# Patient Record
Sex: Female | Born: 1965 | Race: Black or African American | Hispanic: No | Marital: Married | Smoking: Current every day smoker
Health system: Southern US, Community
[De-identification: ages and names within clinical notes are randomized; demographics above are authoritative.]

## PROBLEM LIST (undated history)

## (undated) DIAGNOSIS — I10 Essential (primary) hypertension: Secondary | ICD-10-CM

## (undated) HISTORY — PX: COLPOSCOPY: SHX161

---

## 2004-06-17 ENCOUNTER — Emergency Department: Payer: Self-pay | Admitting: Emergency Medicine

## 2004-06-24 ENCOUNTER — Emergency Department: Payer: Self-pay | Admitting: Unknown Physician Specialty

## 2004-06-25 ENCOUNTER — Emergency Department: Payer: Self-pay | Admitting: Emergency Medicine

## 2004-06-27 ENCOUNTER — Emergency Department: Payer: Self-pay | Admitting: Emergency Medicine

## 2004-09-20 ENCOUNTER — Emergency Department: Payer: Self-pay | Admitting: Emergency Medicine

## 2004-09-21 ENCOUNTER — Emergency Department: Payer: Self-pay | Admitting: Unknown Physician Specialty

## 2004-10-11 ENCOUNTER — Emergency Department: Payer: Self-pay | Admitting: Emergency Medicine

## 2005-01-20 ENCOUNTER — Emergency Department: Payer: Self-pay | Admitting: Emergency Medicine

## 2005-04-27 ENCOUNTER — Emergency Department: Payer: Self-pay | Admitting: Unknown Physician Specialty

## 2005-04-27 ENCOUNTER — Other Ambulatory Visit: Payer: Self-pay

## 2006-01-12 ENCOUNTER — Emergency Department: Payer: Self-pay | Admitting: Unknown Physician Specialty

## 2006-02-12 ENCOUNTER — Emergency Department: Payer: Self-pay | Admitting: Emergency Medicine

## 2006-06-11 ENCOUNTER — Ambulatory Visit: Payer: Self-pay | Admitting: Podiatry

## 2006-09-02 ENCOUNTER — Emergency Department: Payer: Self-pay | Admitting: Emergency Medicine

## 2006-09-04 ENCOUNTER — Emergency Department: Payer: Self-pay | Admitting: Emergency Medicine

## 2006-10-04 ENCOUNTER — Ambulatory Visit: Payer: Self-pay | Admitting: Unknown Physician Specialty

## 2006-10-07 ENCOUNTER — Ambulatory Visit: Payer: Self-pay | Admitting: Unknown Physician Specialty

## 2008-03-21 ENCOUNTER — Emergency Department: Payer: Self-pay | Admitting: Emergency Medicine

## 2009-01-15 ENCOUNTER — Emergency Department: Payer: Self-pay | Admitting: Internal Medicine

## 2009-02-07 ENCOUNTER — Emergency Department: Payer: Self-pay | Admitting: Emergency Medicine

## 2009-04-05 ENCOUNTER — Emergency Department: Payer: Self-pay | Admitting: Emergency Medicine

## 2011-01-13 ENCOUNTER — Emergency Department: Payer: Self-pay | Admitting: Emergency Medicine

## 2011-01-28 ENCOUNTER — Emergency Department: Payer: Self-pay | Admitting: Unknown Physician Specialty

## 2011-03-01 ENCOUNTER — Emergency Department: Payer: Self-pay | Admitting: Emergency Medicine

## 2013-08-24 ENCOUNTER — Emergency Department: Payer: Self-pay

## 2013-08-24 LAB — URINALYSIS, COMPLETE
Bilirubin,UR: NEGATIVE
Blood: NEGATIVE
Glucose,UR: NEGATIVE mg/dL (ref 0–75)
NITRITE: POSITIVE
Ph: 6 (ref 4.5–8.0)
RBC,UR: 3 /HPF (ref 0–5)
SPECIFIC GRAVITY: 1.026 (ref 1.003–1.030)
Squamous Epithelial: 3
WBC UR: 5 /HPF (ref 0–5)

## 2013-08-24 LAB — DRUG SCREEN, URINE

## 2015-10-01 ENCOUNTER — Emergency Department
Admission: EM | Admit: 2015-10-01 | Discharge: 2015-10-01 | Disposition: A | Discharge status: Home / Self Care | Payer: Self-pay | Attending: Emergency Medicine | Admitting: Emergency Medicine

## 2015-10-01 ENCOUNTER — Emergency Department: Payer: Self-pay

## 2015-10-01 DIAGNOSIS — M25461 Effusion, right knee: Secondary | ICD-10-CM | POA: Insufficient documentation

## 2015-10-01 MED ORDER — OXYCODONE-ACETAMINOPHEN 5-325 MG PO TABS
2.0000 | ORAL_TABLET | Freq: Once | ORAL | Status: AC
  Administered 2015-10-01: 2 via ORAL
  Filled 2015-10-01: qty 2

## 2015-10-01 MED ORDER — OXYCODONE-ACETAMINOPHEN 5-325 MG PO TABS: 1.0000 | ORAL_TABLET | ORAL | Status: DC | PRN

## 2015-10-01 MED ORDER — NAPROXEN 500 MG PO TABS: 500.0000 mg | ORAL_TABLET | Freq: Two times a day (BID) | ORAL | Status: DC

## 2015-10-01 NOTE — Discharge Instructions (Signed)
Knee Effusion Knee effusion means that you have excess fluid in your knee joint. This can cause pain and swelling in your knee. This may make your knee more difficult to bend and move. That is because there is increased pain and pressure in the joint. If there is fluid in your knee, it often means that something is wrong inside your knee, such as severe arthritis, abnormal inflammation, or an infection. Another common cause of knee effusion is an injury to the knee muscles, ligaments, or cartilage. HOME CARE INSTRUCTIONS  Use crutches as directed by your health care provider.  Wear a knee brace as directed by your health care provider.  Apply ice to the swollen area:  Put ice in a plastic bag.  Place a towel between your skin and the bag.  Leave the ice on for 20 minutes, 2-3 times per day.  Keep your knee raised (elevated) when you are sitting or lying down.  Take medicines only as directed by your health care provider.  Do any rehabilitation or strengthening exercises as directed by your health care provider.  Rest your knee as directed by your health care provider. You may start doing your normal activities again when your health care provider approves.   Keep all follow-up visits as directed by your health care provider. This is important. SEEK MEDICAL CARE IF:  You have ongoing (persistent) pain in your knee. SEEK IMMEDIATE MEDICAL CARE IF:  You have increased swelling or redness of your knee.  You have severe pain in your knee.  You have a fever.   This information is not intended to replace advice given to you by your health care provider. Make sure you discuss any questions you have with your health care provider.   Document Released: 05/23/2003 Document Revised: 03/23/2014 Document Reviewed: 10/16/2013 Elsevier Interactive Patient Education 2016 [place] therapy can help ease sore, stiff, injured, and tight muscles and joints. Heat relaxes  your muscles, which may help ease your pain. Heat therapy should only be used on old, pre-existing, or long-lasting (chronic) injuries. Do not use heat therapy unless told by your doctor. HOW TO USE HEAT THERAPY There are several different kinds of heat therapy, including:  Moist heat pack.  Warm water bath.  Hot water bottle.  Electric heating pad.  Heated gel pack.  Heated wrap.  Electric heating pad. GENERAL HEAT THERAPY RECOMMENDATIONS   Do not sleep while using heat therapy. Only use heat therapy while you are awake.  Your skin may turn pink while using heat therapy. Do not use heat therapy if your skin turns red.  Do not use heat therapy if you have new pain.  High heat or long exposure to heat can cause burns. Be careful when using heat therapy to avoid burning your skin.  Do not use heat therapy on areas of your skin that are already irritated, such as with a rash or sunburn. GET HELP IF:   You have blisters, redness, swelling (puffiness), or numbness.  You have new pain.  Your pain is worse. MAKE SURE YOU:  Understand these instructions.  Will watch your condition.  Will get help right away if you are not doing well or get worse.   This information is not intended to replace advice given to you by your health care provider. Make sure you discuss any questions you have with your health care provider.   Document Released: 05/25/2011 Document Revised: 03/23/2014 Document Reviewed: 04/25/2013 Elsevier Interactive Patient  Education ©2016 Elsevier Inc. ° °

## 2015-10-01 NOTE — ED Notes (Signed)
Pt reports right knee pain and swelling x1 month; denies injury. Pt ambulatory to triage.

## 2015-10-01 NOTE — ED Provider Notes (Signed)
[facility] Emergency Department Provider Note  ____________________________________________  Time seen: Approximately 8:08 AM  I have reviewed the triage vital signs and the nursing notes.   HISTORY  Chief Complaint Knee Pain    HPI Teresa Manning is a 50 y.o. female who presents for evaluation of progressive right knee pain and swelling 1 month. Denies any known injury. States walking has become increasingly difficult. Denies any numbness or tingling or radiation of pain inside the knee area. Scribe's her pain as 6/10 with no relief with over-the-counter medications   No past medical history on file.  There are no active problems to display for this patient.   No past surgical history on file.  Current Outpatient Rx  Name  Route  Sig  Dispense  Refill  . naproxen (NAPROSYN) 500 MG tablet   Oral   Take 1 tablet (500 mg total) by mouth 2 (two) times daily with a meal.   60 tablet   0   . oxyCODONE-acetaminophen (ROXICET) 5-325 MG tablet   Oral   Take 1-2 tablets by mouth every 4 (four) hours as needed for severe pain.   15 tablet   0     Allergies Review of patient's allergies indicates no known allergies.  No family history on file.  Social History Social History  Substance Use Topics  . Smoking status: Not on file  . Smokeless tobacco: Not on file  . Alcohol Use: Not on file    Review of Systems Constitutional: No fever/chills Cardiovascular: Denies chest pain. Respiratory: Denies shortness of breath. Musculoskeletal: Positive for right knee pain and swelling Skin: Negative for rash. Neurological: Negative for headaches, focal weakness or numbness.  10-point ROS otherwise negative.  ____________________________________________   PHYSICAL EXAM:  VITAL SIGNS: ED Triage Vitals  Enc Vitals Group     BP 10/01/15 0749 136/59 mmHg     Pulse Rate 10/01/15 0749 66     Resp 10/01/15 0749 16     Temp 10/01/15 0749 98.4 F  (36.9 C)     Temp Source 10/01/15 0749 Oral     SpO2 10/01/15 0749 95 %     Weight 10/01/15 0744 235 lb (106.595 kg)     Height 10/01/15 0744 5\' 9"  (1.753 m)     Head Cir --      Peak Flow --      Pain Score 10/01/15 0744 6     Pain Loc --      Pain Edu? --      Excl. in [place]? --     Constitutional: Alert and oriented. Well appearing and in no acute distress.  Cardiovascular: Normal rate, regular rhythm. Grossly normal heart sounds.  Good peripheral circulation. Respiratory: Normal respiratory effort.  No retractions. Lungs CTAB. Musculoskeletal: Right knee with limited range of motion and increased pain with flexion. Distally neurovascularly intact. Knee is obviously boggy and tender fluid noted to the inferior lateral aspect of the patella. No ecchymosis or bruising noted. Neurologic:  Normal speech and language. No gross focal neurologic deficits are appreciated. No gait instability. Skin:  Skin is warm, dry and intact. No rash noted. Psychiatric: Mood and affect are normal. Speech and behavior are normal.  ____________________________________________   LABS (all labs ordered are listed, but only abnormal results are displayed)  Labs Reviewed - No data to display ____________________________________________  EKG   ____________________________________________  RADIOLOGY  IMPRESSION: No fracture or dislocation.  Mild patella [place].  Mild patellofemoral joint degenerative changes.  Minimal medial tibiofemoral joint space narrowing.  Small suprapatellar joint effusion. ____________________________________________   PROCEDURES  Procedure(s) performed: None  Critical Care performed: No  ____________________________________________   INITIAL IMPRESSION / ASSESSMENT AND PLAN / ED COURSE  Pertinent labs & imaging results that were available during my care of the patient were reviewed by me and considered in my medical decision making (see chart for  details).  Right knee joint effusion. Patient has a brace at home that she will continue meters Rx given for Naprosyn 500 mg twice a day continue heat and rest and elevation as much as possible. She refused a work note and to follow-up with orthopedics as needed. ____________________________________________   FINAL CLINICAL IMPRESSION(S) / ED DIAGNOSES  Final diagnoses:  Knee effusion, right     This chart was dictated using voice recognition software/Dragon. Despite best efforts to proofread, errors can occur which can change the meaning. Any change was purely unintentional.   Arlyss Repress, PA-C 10/01/15 0902  Drenda Freeze, MD 10/01/15 1110

## 2016-04-12 ENCOUNTER — Emergency Department
Admission: EM | Admit: 2016-04-12 | Discharge: 2016-04-12 | Disposition: A | Discharge status: Home / Self Care | Payer: BLUE CROSS/BLUE SHIELD | Attending: Emergency Medicine | Admitting: Emergency Medicine

## 2016-04-12 ENCOUNTER — Encounter: Payer: Self-pay | Admitting: Emergency Medicine

## 2016-04-12 DIAGNOSIS — F17210 Nicotine dependence, cigarettes, uncomplicated: Secondary | ICD-10-CM | POA: Diagnosis not present

## 2016-04-12 DIAGNOSIS — R51 Headache: Secondary | ICD-10-CM | POA: Diagnosis not present

## 2016-04-12 DIAGNOSIS — M791 Myalgia: Secondary | ICD-10-CM | POA: Diagnosis not present

## 2016-04-12 DIAGNOSIS — R5381 Other malaise: Secondary | ICD-10-CM | POA: Insufficient documentation

## 2016-04-12 DIAGNOSIS — R0981 Nasal congestion: Secondary | ICD-10-CM | POA: Insufficient documentation

## 2016-04-12 DIAGNOSIS — R69 Illness, unspecified: Secondary | ICD-10-CM

## 2016-04-12 DIAGNOSIS — J111 Influenza due to unidentified influenza virus with other respiratory manifestations: Secondary | ICD-10-CM

## 2016-04-12 DIAGNOSIS — R05 Cough: Secondary | ICD-10-CM | POA: Diagnosis not present

## 2016-04-12 LAB — URINALYSIS, COMPLETE (UACMP) WITH MICROSCOPIC
Bilirubin Urine: NEGATIVE
Glucose, UA: NEGATIVE mg/dL
Ketones, ur: NEGATIVE mg/dL
Leukocytes, UA: NEGATIVE
Nitrite: NEGATIVE
Protein, ur: 30 mg/dL — AB
Specific Gravity, Urine: 1.028 (ref 1.005–1.030)
pH: 5 (ref 5.0–8.0)

## 2016-04-12 MED ORDER — ACETAMINOPHEN 325 MG PO TABS
650.0000 mg | ORAL_TABLET | Freq: Once | ORAL | Status: AC | PRN
  Administered 2016-04-12: 650 mg via ORAL

## 2016-04-12 MED ORDER — BENZONATATE 100 MG PO CAPS: 100.0000 mg | ORAL_CAPSULE | Freq: Three times a day (TID) | ORAL | 0 refills | Status: AC | PRN

## 2016-04-12 MED ORDER — ACETAMINOPHEN 325 MG PO TABS
ORAL_TABLET | ORAL | Status: AC
  Filled 2016-04-12: qty 2

## 2016-04-12 MED ORDER — OSELTAMIVIR PHOSPHATE 75 MG PO CAPS: 75.0000 mg | ORAL_CAPSULE | Freq: Two times a day (BID) | ORAL | 0 refills | Status: AC

## 2016-04-12 NOTE — ED Triage Notes (Signed)
Pt from home with productive cough and generalized body aches since Thursday. Pt states she is coughing a lot and that causes her bladder to leak, which she states is "strong-smelling."  She also reports less urination than usual. Pt alert & oriented with NAD noted.

## 2016-04-12 NOTE — ED Notes (Signed)
First nurse note   Developed cough  Sore throat and body aches on friday

## 2016-04-12 NOTE — ED Notes (Signed)
AAOx3.  Skin warm and dry.  NAD 

## 2016-04-12 NOTE — ED Provider Notes (Signed)
[facility] Emergency Department Provider Note  ____________________________________________  Time seen: Approximately 10:10 AM  I have reviewed the triage vital signs and the nursing notes.   HISTORY  Chief Complaint Generalized Body Aches and Cough    HPI Teresa Manning is a 51 y.o. female presenting to the emergency department with headache, congestion, malaise, myalgias and fatigue for the past 2 days. Patient has numerous sick contacts in her job at [employer]. Patient has been febrile. Fever has been as high as 101F assessed orally. Patient states that she has been eating and drinking less due to increased sleep. Patient states that she is tolerating fluids by mouth. She denies chest pain, chest tightness, dizziness, shortness of breath, nausea, abdominal pain and diarrhea. Patient has taken Tylenol but has attempted no other alleviating measures.   History reviewed. No pertinent past medical history.  There are no active problems to display for this patient.   History reviewed. No pertinent surgical history.  Prior to Admission medications   Medication Sig Start Date End Date Taking? Authorizing Provider  benzonatate (TESSALON PERLES) 100 MG capsule Take 1 capsule (100 mg total) by mouth 3 (three) times daily as needed for cough. 04/12/16 04/19/16  Lannie Fields, PA-C  naproxen (NAPROSYN) 500 MG tablet Take 1 tablet (500 mg total) by mouth 2 (two) times daily with a meal. 10/01/15   Arlyss Repress, PA-C  oseltamivir (TAMIFLU) 75 MG capsule Take 1 capsule (75 mg total) by mouth 2 (two) times daily. 04/12/16 04/17/16  Lannie Fields, PA-C  oxyCODONE-acetaminophen (ROXICET) 5-325 MG tablet Take 1-2 tablets by mouth every 4 (four) hours as needed for severe pain. 10/01/15   Arlyss Repress, PA-C    Allergies Patient has no known allergies.  History reviewed. No pertinent family history.  Social History Social History  Substance Use Topics  . Smoking status:  Current Every Day Smoker    Packs/day: 1.00    Types: Cigarettes  . Smokeless tobacco: Never Used  . Alcohol use No     Review of Systems  Constitutional: Patient has had fever.  Eyes: No visual changes. No discharge ENT: Patient has had congestion.  Cardiovascular: no chest pain. Respiratory: Patient has had non-productive cough.  No SOB. Gastrointestinal: Patient has had nausea.  Genitourinary: Negative for dysuria. No hematuria Musculoskeletal: Patient has had myalgias. Skin: Negative for rash, abrasions, lacerations, ecchymosis. Neurological: Patient has had headache, no focal weakness or numbness.   ____________________________________________   PHYSICAL EXAM:  VITAL SIGNS: ED Triage Vitals  Enc Vitals Group     BP 04/12/16 0833 135/74     Pulse Rate 04/12/16 0833 (!) 102     Resp 04/12/16 0833 16     Temp 04/12/16 0833 100.3 F (37.9 C)     Temp Source 04/12/16 0833 Oral     SpO2 04/12/16 0833 96 %     Weight 04/12/16 0835 245 lb (111.1 kg)     Height 04/12/16 0835 5\' 9"  (1.753 m)     Head Circumference --      Peak Flow --      Pain Score 04/12/16 0935 6     Pain Loc --      Pain Edu? --      Excl. in [place]? --     Constitutional: Alert and oriented. Patient is lying supine in bed.  Eyes: Conjunctivae are normal. PERRL. EOMI. Head: Atraumatic. ENT:      Ears: Tympanic membranes are injected bilaterally  without evidence of effusion or purulent exudate. Bony landmarks are visualized bilaterally. No pain with palpation at the tragus.      Nose: Nasal turbinates are edematous and erythematous. Copious rhinorrhea visualized.      Mouth/Throat: Mucous membranes are moist. Posterior pharynx is mildly erythematous. No tonsillar hypertrophy or purulent exudate. Uvula is midline. Neck: Full range of motion. No pain is elicited with flexion at the neck. Hematological/Lymphatic/Immunilogical: No cervical lymphadenopathy. Cardiovascular: Normal rate, regular rhythm.  Normal S1 and S2.  Good peripheral circulation. Respiratory: Normal respiratory effort without tachypnea or retractions. Lungs CTAB. Good air entry to the bases with no decreased or absent breath sounds. Gastrointestinal: Bowel sounds 4 quadrants. Soft and nontender to palpation. No guarding or rigidity. No palpable masses. No distention. No CVA tenderness.  Skin:  Skin is warm, dry and intact. No rash noted. Psychiatric: Mood and affect are normal. Speech and behavior are normal. Patient exhibits appropriate insight and judgement. ____________________________________________   LABS (all labs ordered are listed, but only abnormal results are displayed)  Labs Reviewed  URINALYSIS, COMPLETE (UACMP) WITH MICROSCOPIC - Abnormal; Notable for the following:       Result Value   Color, Urine YELLOW (*)    APPearance HAZY (*)    Hgb urine dipstick SMALL (*)    Protein, ur 30 (*)    Bacteria, UA RARE (*)    Squamous Epithelial / LPF 6-30 (*)    All other components within normal limits   ____________________________________________  EKG   ____________________________________________  RADIOLOGY  No results found.  ____________________________________________    PROCEDURES  Procedure(s) performed:    Procedures    Medications  acetaminophen (TYLENOL) tablet 650 mg (650 mg Oral Given 04/12/16 0931)     ____________________________________________   INITIAL IMPRESSION / ASSESSMENT AND PLAN / ED COURSE  Pertinent labs & imaging results that were available during my care of the patient were reviewed by me and considered in my medical decision making (see chart for details).  Review of the [place] CSRS was performed in accordance of the [place] prior to dispensing any controlled drugs.    Assessment and Plan:  Influenza A Patient presents to the emergency department with headache, congestion, malaise, myalgias and fatigue for the past 2 days. Symptoms are consistent with  influenza. Patient was discharged with Tamiflu and Tessalon Perles. Physical exam and vitals are reassuring at this time. Heart rate was reassessed at 98 bpm prior to discharge. Patient was advised to follow-up with her primary care provider in one week. All patient questions were answered.  ____________________________________________  FINAL CLINICAL IMPRESSION(S) / ED DIAGNOSES  Final diagnoses:  Influenza-like illness      NEW MEDICATIONS STARTED DURING THIS VISIT:  Discharge Medication List as of 04/12/2016 10:17 AM    START taking these medications   Details  benzonatate (TESSALON PERLES) 100 MG capsule Take 1 capsule (100 mg total) by mouth 3 (three) times daily as needed for cough., Starting Sun 04/12/2016, Until Sun 04/19/2016, Print    oseltamivir (TAMIFLU) 75 MG capsule Take 1 capsule (75 mg total) by mouth 2 (two) times daily., Starting Sun 04/12/2016, Until Fri 04/17/2016, Print            This chart was dictated using voice recognition software/Dragon. Despite best efforts to proofread, errors can occur which can change the meaning. Any change was purely unintentional.    Lannie Fields, PA-C 04/12/16 1856    Lavonia Drafts, MD 04/13/16 [phone]

## 2016-04-21 ENCOUNTER — Emergency Department: Payer: BLUE CROSS/BLUE SHIELD

## 2016-04-21 ENCOUNTER — Emergency Department
Admission: EM | Admit: 2016-04-21 | Discharge: 2016-04-21 | Disposition: A | Discharge status: Home / Self Care | Payer: BLUE CROSS/BLUE SHIELD | Attending: Emergency Medicine | Admitting: Emergency Medicine

## 2016-04-21 DIAGNOSIS — R112 Nausea with vomiting, unspecified: Secondary | ICD-10-CM

## 2016-04-21 DIAGNOSIS — R197 Diarrhea, unspecified: Secondary | ICD-10-CM | POA: Diagnosis not present

## 2016-04-21 DIAGNOSIS — F17210 Nicotine dependence, cigarettes, uncomplicated: Secondary | ICD-10-CM | POA: Insufficient documentation

## 2016-04-21 DIAGNOSIS — J40 Bronchitis, not specified as acute or chronic: Secondary | ICD-10-CM | POA: Diagnosis not present

## 2016-04-21 LAB — CBC WITH DIFFERENTIAL/PLATELET
BASOS ABS: 0 10*3/uL (ref 0–0.1)
BASOS PCT: 0 %
Eosinophils Absolute: 0.1 10*3/uL (ref 0–0.7)
Eosinophils Relative: 1 %
HEMATOCRIT: 42.4 % (ref 35.0–47.0)
HEMOGLOBIN: 14 g/dL (ref 12.0–16.0)
LYMPHS PCT: 9 %
Lymphs Abs: 0.7 10*3/uL — ABNORMAL LOW (ref 1.0–3.6)
MCH: 29.2 pg (ref 26.0–34.0)
MCHC: 33 g/dL (ref 32.0–36.0)
MCV: 88.7 fL (ref 80.0–100.0)
Monocytes Absolute: 0.3 10*3/uL (ref 0.2–0.9)
Monocytes Relative: 4 %
NEUTROS ABS: 6.5 10*3/uL (ref 1.4–6.5)
NEUTROS PCT: 86 %
Platelets: 267 10*3/uL (ref 150–440)
RBC: 4.78 MIL/uL (ref 3.80–5.20)
RDW: 13.8 % (ref 11.5–14.5)
WBC: 7.6 10*3/uL (ref 3.6–11.0)

## 2016-04-21 LAB — BASIC METABOLIC PANEL
ANION GAP: 6 (ref 5–15)
BUN: 10 mg/dL (ref 6–20)
CALCIUM: 8.3 mg/dL — AB (ref 8.9–10.3)
CO2: 26 mmol/L (ref 22–32)
Chloride: 104 mmol/L (ref 101–111)
Creatinine, Ser: 0.8 mg/dL (ref 0.44–1.00)
Glucose, Bld: 123 mg/dL — ABNORMAL HIGH (ref 65–99)
POTASSIUM: 4 mmol/L (ref 3.5–5.1)
Sodium: 136 mmol/L (ref 135–145)

## 2016-04-21 MED ORDER — ONDANSETRON 4 MG PO TBDP: 4.0000 mg | ORAL_TABLET | Freq: Four times a day (QID) | ORAL | 0 refills | Status: DC | PRN

## 2016-04-21 MED ORDER — DICYCLOMINE HCL 20 MG PO TABS: 20.0000 mg | ORAL_TABLET | Freq: Three times a day (TID) | ORAL | 0 refills | Status: DC | PRN

## 2016-04-21 MED ORDER — ACETAMINOPHEN 500 MG PO TABS
1000.0000 mg | ORAL_TABLET | Freq: Once | ORAL | Status: AC
  Administered 2016-04-21: 1000 mg via ORAL

## 2016-04-21 MED ORDER — ONDANSETRON HCL 4 MG/2ML IJ SOLN
4.0000 mg | Freq: Once | INTRAMUSCULAR | Status: AC
  Administered 2016-04-21: 4 mg via INTRAVENOUS
  Filled 2016-04-21: qty 2

## 2016-04-21 MED ORDER — ACETAMINOPHEN 500 MG PO TABS
ORAL_TABLET | ORAL | Status: AC
  Filled 2016-04-21: qty 2

## 2016-04-21 MED ORDER — PSEUDOEPH-BROMPHEN-DM 30-2-10 MG/5ML PO SYRP: 5.0000 mL | ORAL_SOLUTION | Freq: Four times a day (QID) | ORAL | 0 refills | Status: DC | PRN

## 2016-04-21 MED ORDER — SODIUM CHLORIDE 0.9 % IV BOLUS (SEPSIS)
1000.0000 mL | Freq: Once | INTRAVENOUS | Status: AC
  Administered 2016-04-21: 1000 mL via INTRAVENOUS

## 2016-04-21 MED ORDER — FAMOTIDINE IN NACL 20-0.9 MG/50ML-% IV SOLN
20.0000 mg | Freq: Once | INTRAVENOUS | Status: AC
  Administered 2016-04-21: 20 mg via INTRAVENOUS
  Filled 2016-04-21: qty 50

## 2016-04-21 NOTE — ED Provider Notes (Signed)
[facility] Emergency Department Provider Note ____________________________________________  Time seen: 1930  I have reviewed the triage vital signs and the nursing notes.  HISTORY  Chief Complaint  Vomiting and Diarrhea   HPI Teresa Manning is a 51 y.o. female presents to the ED for evaluation of sudden onset of nausea, vomiting, and diarrhea. The patient describes eating which she assumed to be a three-day tuna sandwhich may by a coworker. She ate the salad at about 11:00 last night. At times she awoke this morning she had the onset of watery diarrhea, abdominal cramping, and malaise. She describes non-bilious nonbloody vomitus that did slow down after a coworker gave her a nausea medicine. She describes most recent episode was just prior to arrival here in the ED. She reports she was seen in the ED about a week and a half ago and treated for influenza. She was given a Tamiflu prescription, but did not fill it as the pharmacy had no Tamiflu supplies. Since that time he's had a lingering cough as well. He ascribes taken a dose of Imodium, Zofran, and some form of upset stomach pill today prior to arrival. She was unaware of her fever upon arrival.  History reviewed. No pertinent past medical history.  There are no active problems to display for this patient.  History reviewed. No pertinent surgical history.  Prior to Admission medications   Medication Sig Start Date End Date Taking? Authorizing Provider  brompheniramine-pseudoephedrine-DM (BROMFED DM) 30-2-10 MG/5ML syrup Take 5 mLs by mouth 4 (four) times daily as needed. 04/21/16   Rhylee Pucillo V Bacon Tejuan Gholson, PA-C  dicyclomine (BENTYL) 20 MG tablet Take 1 tablet (20 mg total) by mouth 3 (three) times daily as needed for spasms. 04/21/16 04/21/17  Dannielle Karvonen Ciria Bernardini, PA-C  naproxen (NAPROSYN) 500 MG tablet Take 1 tablet (500 mg total) by mouth 2 (two) times daily with a meal. 10/01/15   Pierce Crane Beers, PA-C  ondansetron  (ZOFRAN ODT) 4 MG disintegrating tablet Take 1 tablet (4 mg total) by mouth every 6 (six) hours as needed for nausea or vomiting. 04/21/16   Staphanie Harbison V Bacon Rhea Thrun, PA-C  oxyCODONE-acetaminophen (ROXICET) 5-325 MG tablet Take 1-2 tablets by mouth every 4 (four) hours as needed for severe pain. 10/01/15   Arlyss Repress, PA-C    Allergies Patient has no known allergies.  History reviewed. No pertinent family history.  Social History Social History  Substance Use Topics  . Smoking status: Current Every Day Smoker    Packs/day: 1.00    Types: Cigarettes  . Smokeless tobacco: Never Used  . Alcohol use No    Review of Systems  Constitutional: Positive for fever. Eyes: Negative for visual changes. ENT: Negative for sore throat. Cardiovascular: Negative for chest pain. Respiratory: Negative for shortness of breath. Reports cough Gastrointestinal: Positive for abdominal pain, vomiting and diarrhea. Genitourinary: Negative for dysuria. Musculoskeletal: Negative for back pain. Skin: Negative for rash. Neurological: Negative for headaches, focal weakness or numbness. ____________________________________________  PHYSICAL EXAM:  VITAL SIGNS: ED Triage Vitals [04/21/16 1840]  Enc Vitals Group     BP 122/64     Pulse Rate 94     Resp 18     Temp (!) 101.3 F (38.5 C)     Temp Source Oral     SpO2 99 %     Weight      Height      Head Circumference      Peak Flow  Pain Score 0     Pain Loc      Pain Edu?      Excl. in [place]?     Constitutional: Alert and oriented. Well appearing and in no distress. Head: Normocephalic and atraumatic. Cardiovascular: Normal rate, regular rhythm. Normal distal pulses. Respiratory: Normal respiratory effort. No wheezes/rales/rhonchi. Gastrointestinal: Soft and nontender. No distention, rebound, guarding, organomegaly or rigidity. No CVA tenderness. Musculoskeletal: Nontender with normal range of motion in all extremities.  Neurologic:   Normal gait without ataxia. Normal speech and language. No gross focal neurologic deficits are appreciated. Skin:  Skin is warm, dry and intact. No rash noted. ____________________________________________   LABS (pertinent positives/negatives)  Labs Reviewed  BASIC METABOLIC PANEL - Abnormal; Notable for the following:       Result Value   Glucose, Bld 123 (*)    Calcium 8.3 (*)    All other components within normal limits  CBC WITH DIFFERENTIAL/PLATELET - Abnormal; Notable for the following:    Lymphs Abs 0.7 (*)    All other components within normal limits   ____________________________________________   RADIOLOGY  CXR IMPRESSION: 1. Prominent interstitial opacities in the lower lobes suggests bronchial inflammation. 2. There is no focal infiltrate. ____________________________________________  PROCEDURES  NS 1000 ml IVP Zofran 4 mg IVP Pepcid 20 mg IVP Tylenol 1000 mg PO ____________________________________________  INITIAL IMPRESSION / ASSESSMENT AND PLAN / ED COURSE  Patient with a clinical presentation consistent with an acute likely infectious gastroenteritis versus food poisoning exposure. She is tolerating by mouth fluids at this time, and is discharged with prescriptions for Zofran, Bentyl, and Bromfed-DM syrup. She will follow up with the primary care provider as needed and dehydrated to prevent dehydration. She will return to the ED as needed. ____________________________________________  FINAL CLINICAL IMPRESSION(S) / ED DIAGNOSES  Final diagnoses:  Nausea vomiting and diarrhea  Bronchitis      Melvenia Needles, PA-C 04/22/16 0019    Nance Pear, MD 04/22/16 2257

## 2016-04-21 NOTE — Discharge Instructions (Signed)
Your exam, labs, and chest x-ray are all normal today. You may have a bout of food poisoning. Take the prescription meds for nausea and abdominal pain. You may take OTC anti-diarrhea medicine as needed. Increase fluid and carbohydrate-rich foods for diarrhea and dehydration prevention. Follow-up with your provider or [facility] as needed. You may also have a mild bronchitis, take the cough medicine as needed.

## 2016-04-21 NOTE — ED Triage Notes (Signed)
Pt presents to ED from work stating that she thinks she has food poisoning, ate a tuna fish sandwich last night and cereal this morning, states N&V&D.   Pt states 9 days ago she was here for flu, thought she was over that. States body aches and abdominal pain.   Nurse at work gave pt imodium, nausea meds, and stomach cramp meds.

## 2016-06-24 ENCOUNTER — Emergency Department: Payer: BLUE CROSS/BLUE SHIELD

## 2016-06-24 ENCOUNTER — Emergency Department
Admission: EM | Admit: 2016-06-24 | Discharge: 2016-06-24 | Disposition: A | Discharge status: Home / Self Care | Payer: BLUE CROSS/BLUE SHIELD | Attending: Emergency Medicine | Admitting: Emergency Medicine

## 2016-06-24 DIAGNOSIS — F17210 Nicotine dependence, cigarettes, uncomplicated: Secondary | ICD-10-CM | POA: Insufficient documentation

## 2016-06-24 DIAGNOSIS — R102 Pelvic and perineal pain: Secondary | ICD-10-CM

## 2016-06-24 DIAGNOSIS — N8500 Endometrial hyperplasia, unspecified: Secondary | ICD-10-CM | POA: Insufficient documentation

## 2016-06-24 DIAGNOSIS — D250 Submucous leiomyoma of uterus: Secondary | ICD-10-CM | POA: Insufficient documentation

## 2016-06-24 LAB — URINALYSIS, COMPLETE (UACMP) WITH MICROSCOPIC
Bilirubin Urine: NEGATIVE
Glucose, UA: NEGATIVE mg/dL
HGB URINE DIPSTICK: NEGATIVE
Ketones, ur: NEGATIVE mg/dL
Leukocytes, UA: NEGATIVE
NITRITE: NEGATIVE
PH: 6 (ref 5.0–8.0)
Protein, ur: NEGATIVE mg/dL
RBC / HPF: NONE SEEN RBC/hpf (ref 0–5)
SPECIFIC GRAVITY, URINE: 1.004 — AB (ref 1.005–1.030)

## 2016-06-24 LAB — COMPREHENSIVE METABOLIC PANEL
ALBUMIN: 3.3 g/dL — AB (ref 3.5–5.0)
ALK PHOS: 68 U/L (ref 38–126)
ALT: 13 U/L — AB (ref 14–54)
AST: 21 U/L (ref 15–41)
Anion gap: 4 — ABNORMAL LOW (ref 5–15)
BILIRUBIN TOTAL: 0.4 mg/dL (ref 0.3–1.2)
BUN: 11 mg/dL (ref 6–20)
CO2: 23 mmol/L (ref 22–32)
CREATININE: 0.81 mg/dL (ref 0.44–1.00)
Calcium: 8.1 mg/dL — ABNORMAL LOW (ref 8.9–10.3)
Chloride: 106 mmol/L (ref 101–111)
GFR calc Af Amer: >60 mL/min (ref >60)
GLUCOSE: 186 mg/dL — AB (ref 65–99)
POTASSIUM: 3.8 mmol/L (ref 3.5–5.1)
Sodium: 133 mmol/L — ABNORMAL LOW (ref 135–145)
TOTAL PROTEIN: 6.5 g/dL (ref 6.5–8.1)

## 2016-06-24 LAB — CBC
HEMATOCRIT: 36.3 % (ref 35.0–47.0)
Hemoglobin: 12 g/dL (ref 12.0–16.0)
MCH: 29.1 pg (ref 26.0–34.0)
MCHC: 33.1 g/dL (ref 32.0–36.0)
MCV: 87.8 fL (ref 80.0–100.0)
PLATELETS: 246 10*3/uL (ref 150–440)
RBC: 4.14 MIL/uL (ref 3.80–5.20)
RDW: 14 % (ref 11.5–14.5)
WBC: 7.5 10*3/uL (ref 3.6–11.0)

## 2016-06-24 LAB — CHLAMYDIA/NGC RT PCR (ARMC ONLY)
CHLAMYDIA TR: NOT DETECTED
N gonorrhoeae: NOT DETECTED

## 2016-06-24 LAB — WET PREP, GENITAL
Sperm: NONE SEEN
TRICH WET PREP: NONE SEEN
WBC, Wet Prep HPF POC: NONE SEEN
YEAST WET PREP: NONE SEEN

## 2016-06-24 LAB — LIPASE, BLOOD: Lipase: 22 U/L (ref 11–51)

## 2016-06-24 LAB — POCT PREGNANCY, URINE: PREG TEST UR: NEGATIVE

## 2016-06-24 MED ORDER — HYDROCODONE-ACETAMINOPHEN 5-325 MG PO TABS: 1.0000 | ORAL_TABLET | Freq: Four times a day (QID) | ORAL | 0 refills | Status: DC | PRN

## 2016-06-24 MED ORDER — SODIUM CHLORIDE 0.9 % IV BOLUS (SEPSIS)
1000.0000 mL | Freq: Once | INTRAVENOUS | Status: AC
  Administered 2016-06-24: 1000 mL via INTRAVENOUS

## 2016-06-24 NOTE — ED Provider Notes (Signed)
[facility] Provider Note   CSN: [id number] Arrival date & time: 06/24/16  0620     History   Chief Complaint Chief Complaint  Patient presents with  . Abdominal Pain    HPI Teresa Manning is a 51 y.o. female of his healthy here presenting with pelvic pain. Patient states that she's been having irregular periods for the last year or so. Her last menstrual period was about a week ago. She been having intermittent pelvic pain for the last 3 weeks. States that she had sex with her husband 3 weeks ago and had pain during sex and constant pelvic pain since then. Pain worse with movement. Denies dysuria or constipation or vomiting or fevers. Denies flank pain. Pain prevents her from sleeping at night. Denies vaginal discharge. Denies hx of ovarian cysts or tumors.   The history is provided by the patient.    No past medical history on file.  There are no active problems to display for this patient.   No past surgical history on file.  OB History    No data available       Home Medications    Prior to Admission medications   Medication Sig Start Date End Date Taking? Authorizing Provider  naproxen (NAPROSYN) 500 MG tablet Take 1 tablet (500 mg total) by mouth 2 (two) times daily with a meal. Patient not taking: Reported on 06/24/2016 10/01/15   Pierce Crane Beers, PA-C  ondansetron (ZOFRAN ODT) 4 MG disintegrating tablet Take 1 tablet (4 mg total) by mouth every 6 (six) hours as needed for nausea or vomiting. Patient not taking: Reported on 06/24/2016 04/21/16   Dannielle Karvonen Menshew, PA-C  oxyCODONE-acetaminophen (ROXICET) 5-325 MG tablet Take 1-2 tablets by mouth every 4 (four) hours as needed for severe pain. Patient not taking: Reported on 06/24/2016 10/01/15   Arlyss Repress, PA-C    Family History No family history on file.  Social History Social History  Substance Use Topics  . Smoking status: Current Every Day Smoker    Packs/day: 1.00    Types:  Cigarettes  . Smokeless tobacco: Never Used  . Alcohol use No     Allergies   Patient has no known allergies.   Review of Systems Review of Systems  Gastrointestinal: Positive for abdominal pain.  All other systems reviewed and are negative.    Physical Exam Updated Vital Signs BP 124/70 (BP Location: Right Arm)   Pulse 74   Temp 98.4 F (36.9 C) (Oral)   Resp 16   Ht 5\' 9"  (1.753 m)   Wt 245 lb (111.1 kg)   SpO2 99%   BMI 36.18 kg/m   Physical Exam  Constitutional: She is oriented to person, place, and time. She appears well-developed and well-nourished.  HENT:  Head: Normocephalic.  Mouth/Throat: Oropharynx is clear and moist.  Eyes: EOM are normal. Pupils are equal, round, and reactive to light.  Neck: Normal range of motion. Neck supple.  Cardiovascular: Normal rate, regular rhythm and normal heart sounds.   Pulmonary/Chest: Effort normal and breath sounds normal. No respiratory distress. She has no wheezes.  Abdominal: Soft. Bowel sounds are normal.  Mild LLQ/ L adnexal tenderness   Genitourinary:  Genitourinary Comments: Os closed, no CMT. Mild uterine and L adnexal tenderness.   Musculoskeletal: Normal range of motion.  Neurological: She is alert and oriented to person, place, and time. No cranial nerve deficit. Coordination normal.  Skin: Skin is warm.  Psychiatric: She has a normal  mood and affect.  Nursing note and vitals reviewed.    ED Treatments / Results  Labs (all labs ordered are listed, but only abnormal results are displayed) Labs Reviewed  WET PREP, GENITAL - Abnormal; Notable for the following:       Result Value   Clue Cells Wet Prep HPF POC PRESENT (*)    All other components within normal limits  COMPREHENSIVE METABOLIC PANEL - Abnormal; Notable for the following:    Sodium 133 (*)    Glucose, Bld 186 (*)    Calcium 8.1 (*)    Albumin 3.3 (*)    ALT 13 (*)    Anion gap 4 (*)    All other components within normal limits    URINALYSIS, COMPLETE (UACMP) WITH MICROSCOPIC - Abnormal; Notable for the following:    Color, Urine STRAW (*)    APPearance CLEAR (*)    Specific Gravity, Urine 1.004 (*)    Bacteria, UA FEW (*)    Squamous Epithelial / LPF 0-5 (*)    All other components within normal limits  CHLAMYDIA/NGC RT PCR ([facility] ONLY)  LIPASE, BLOOD  CBC  POCT PREGNANCY, URINE  POC URINE PREG, ED    EKG  EKG Interpretation None       Radiology US Transvaginal Non-ob  Result Date: 06/24/2016 CLINICAL DATA:  Left adnexal tenderness for the past 2 weeks. Questionable history of postmenopausal bleeding. EXAM: TRANSABDOMINAL AND TRANSVAGINAL ULTRASOUND OF PELVIS DOPPLER ULTRASOUND OF OVARIES TECHNIQUE: Both transabdominal and transvaginal ultrasound examinations of the pelvis were performed. Transabdominal technique was performed for global imaging of the pelvis including uterus, ovaries, adnexal regions, and pelvic cul-de-sac. It was necessary to proceed with endovaginal exam following the transabdominal exam to visualize the endometrium and ovaries. Color and duplex Doppler ultrasound was utilized to evaluate blood flow to the ovaries. COMPARISON:  None. FINDINGS: Uterus Measurements: 12.3 x 6.4 x 5.9 cm. There are multiple uterine fibroids. On the right a fundal fibroid posteriorly measures 4.5 x 4 x 4.2 cm. On the left and anterior mid corpus fibroid measures 4.4 x 4.5 x 4.6 cm. Endometrium Thickness: 9 mm.  No focal abnormality visualized. Right ovary Measurements: 4.3 x 2.3 x 2.3 cm. Normal appearance/no adnexal mass. Left ovary Measurements: 2.2 x 2.3 x 1.3 cm. Normal appearance/no adnexal mass. Pulsed Doppler evaluation of both ovaries demonstrates normal low-resistance arterial and venous waveforms. Other findings There is no free pelvic fluid. IMPRESSION: Multiple uterine fibroids. The endometrial stripe measures 9 mm but exhibits no focal In the setting of post-menopausal bleeding, endometrial  sampling is indicated to exclude carcinoma. If results are benign, sonohysterogram should be considered for focal lesion work-up. (Ref: Radiological Reasoning: Algorithmic Workup of Abnormal Vaginal Bleeding with Endovaginal Sonography and Sonohysterography. AJR 2008; 810:F75-10) abnormalities. No ovarian or adnexal masses or free pelvic fluid. Vascularity of the ovaries appears normal. Electronically Signed   By: Nasiya Pascual  [place] M.D.   On: 06/24/2016 08:56   US Pelvis Complete  Result Date: 06/24/2016 CLINICAL DATA:  Left adnexal tenderness for the past 2 weeks. Questionable history of postmenopausal bleeding. EXAM: TRANSABDOMINAL AND TRANSVAGINAL ULTRASOUND OF PELVIS DOPPLER ULTRASOUND OF OVARIES TECHNIQUE: Both transabdominal and transvaginal ultrasound examinations of the pelvis were performed. Transabdominal technique was performed for global imaging of the pelvis including uterus, ovaries, adnexal regions, and pelvic cul-de-sac. It was necessary to proceed with endovaginal exam following the transabdominal exam to visualize the endometrium and ovaries. Color and duplex Doppler ultrasound was utilized to evaluate blood flow  to the ovaries. COMPARISON:  None. FINDINGS: Uterus Measurements: 12.3 x 6.4 x 5.9 cm. There are multiple uterine fibroids. On the right a fundal fibroid posteriorly measures 4.5 x 4 x 4.2 cm. On the left and anterior mid corpus fibroid measures 4.4 x 4.5 x 4.6 cm. Endometrium Thickness: 9 mm.  No focal abnormality visualized. Right ovary Measurements: 4.3 x 2.3 x 2.3 cm. Normal appearance/no adnexal mass. Left ovary Measurements: 2.2 x 2.3 x 1.3 cm. Normal appearance/no adnexal mass. Pulsed Doppler evaluation of both ovaries demonstrates normal low-resistance arterial and venous waveforms. Other findings There is no free pelvic fluid. IMPRESSION: Multiple uterine fibroids. The endometrial stripe measures 9 mm but exhibits no focal In the setting of post-menopausal bleeding, endometrial  sampling is indicated to exclude carcinoma. If results are benign, sonohysterogram should be considered for focal lesion work-up. (Ref: Radiological Reasoning: Algorithmic Workup of Abnormal Vaginal Bleeding with Endovaginal Sonography and Sonohysterography. AJR 2008; 478:G95-62) abnormalities. No ovarian or adnexal masses or free pelvic fluid. Vascularity of the ovaries appears normal. Electronically Signed   By: Tiler Brandis  [place] M.D.   On: 06/24/2016 08:56   [place] Art/ven Flow Abd Pelv Doppler  Result Date: 06/24/2016 CLINICAL DATA:  Left adnexal tenderness for the past 2 weeks. Questionable history of postmenopausal bleeding. EXAM: TRANSABDOMINAL AND TRANSVAGINAL ULTRASOUND OF PELVIS DOPPLER ULTRASOUND OF OVARIES TECHNIQUE: Both transabdominal and transvaginal ultrasound examinations of the pelvis were performed. Transabdominal technique was performed for global imaging of the pelvis including uterus, ovaries, adnexal regions, and pelvic cul-de-sac. It was necessary to proceed with endovaginal exam following the transabdominal exam to visualize the endometrium and ovaries. Color and duplex Doppler ultrasound was utilized to evaluate blood flow to the ovaries. COMPARISON:  None. FINDINGS: Uterus Measurements: 12.3 x 6.4 x 5.9 cm. There are multiple uterine fibroids. On the right a fundal fibroid posteriorly measures 4.5 x 4 x 4.2 cm. On the left and anterior mid corpus fibroid measures 4.4 x 4.5 x 4.6 cm. Endometrium Thickness: 9 mm.  No focal abnormality visualized. Right ovary Measurements: 4.3 x 2.3 x 2.3 cm. Normal appearance/no adnexal mass. Left ovary Measurements: 2.2 x 2.3 x 1.3 cm. Normal appearance/no adnexal mass. Pulsed Doppler evaluation of both ovaries demonstrates normal low-resistance arterial and venous waveforms. Other findings There is no free pelvic fluid. IMPRESSION: Multiple uterine fibroids. The endometrial stripe measures 9 mm but exhibits no focal In the setting of post-menopausal bleeding,  endometrial sampling is indicated to exclude carcinoma. If results are benign, sonohysterogram should be considered for focal lesion work-up. (Ref: Radiological Reasoning: Algorithmic Workup of Abnormal Vaginal Bleeding with Endovaginal Sonography and Sonohysterography. AJR 2008; 130:Q65-78) abnormalities. No ovarian or adnexal masses or free pelvic fluid. Vascularity of the ovaries appears normal. Electronically Signed   By: Tevan Marian  [place] M.D.   On: 06/24/2016 08:56    Procedures Procedures (including critical care time)  Medications Ordered in ED Medications  sodium chloride 0.9 % bolus 1,000 mL (1,000 mLs Intravenous New Bag/Given 06/24/16 0848)     Initial Impression / Assessment and Plan / ED Course  I have reviewed the triage vital signs and the nursing notes.  Pertinent labs & imaging results that were available during my care of the patient were reviewed by me and considered in my medical decision making (see chart for details).    Teresa Manning is a 51 y.o. female here with lower abdominal pain, L pelvic pain. She is peri menopause. Consider ovarian vs uterine cancer vs dyspareunia from being perimenopause.  No constipation or diarrhea or fevers to suggest diverticulitis and symptoms going on for several weeks already.    10:12 AM US showed fibroids and thickened endometrium. No obvious masses. Labs unremarkable. Wet prep + clue cells but she has no discharge so will avoid treating. I think symptoms likely from fibroids. Since she is perimenopause, I recommend endometrial biopsy to r/o endometrial cancer. Will have her follow up with GYN outpatient, will give vicodin prn pain    Final Clinical Impressions(s) / ED Diagnoses   Final diagnoses:  Left adnexal tenderness  Left adnexal tenderness    New Prescriptions New Prescriptions   No medications on file     Drenda Freeze, MD 06/24/16 1014

## 2016-06-24 NOTE — Discharge Instructions (Signed)
Take motrin for pain.   Take vicodin for severe pain. DO NOT drive with it.   See your GYN doctor to discuss options for fibroids. The lining of your uterus is thickened so consider a biopsy   Return to ER if you have worse vaginal bleeding, severe pelvic pain, vomiting, fevers.

## 2016-06-24 NOTE — ED Notes (Signed)
Pt discharged home after verbalizing understanding of discharge instructions; nad noted. 

## 2016-06-24 NOTE — ED Triage Notes (Signed)
Pt in with co lower abd pain that radiates into pelvis, and rectal area. States worse when she walks and sits, denies any dysuria.

## 2016-06-25 ENCOUNTER — Ambulatory Visit (INDEPENDENT_AMBULATORY_CARE_PROVIDER_SITE_OTHER): Payer: BLUE CROSS/BLUE SHIELD | Admitting: Obstetrics and Gynecology

## 2016-06-25 ENCOUNTER — Encounter: Payer: Self-pay | Admitting: Obstetrics and Gynecology

## 2016-06-25 VITALS — BP 146/80 | HR 73 | Ht 69.0 in | Wt 238.5 lb

## 2016-06-25 DIAGNOSIS — R102 Pelvic and perineal pain: Secondary | ICD-10-CM

## 2016-06-25 DIAGNOSIS — D251 Intramural leiomyoma of uterus: Secondary | ICD-10-CM | POA: Diagnosis not present

## 2016-06-25 NOTE — Progress Notes (Addendum)
HPI:      [name] is a 51 y.o. [id number] who LMP was Patient's last menstrual period was 06/17/2016 (exact date).  Subjective:   She presents today After being seen in the emergency department for pelvic pain. She describes irregular menses over the last year. Probably about 4 per year. Her ultrasound in the emergency department revealed uterine fibroids and a slightly thickened endometrium for menopause, but normal for climacteric or perimenopause. The patient's pain is improved since her emergency department visit but she still describes a pelvic fullness.     Hx: The following portions of the patient's history were reviewed and updated as appropriate:              She  has no past medical history on file. She  does not have a problem list on file. She  has a past surgical history that includes Colposcopy. Her family history includes Diabetes in her mother; Hyperlipidemia in her mother; Hypertension in her mother. She  reports that she has been smoking Cigarettes.  She has been smoking about 1.00 pack per day. She has never used smokeless tobacco. She reports that she does not drink alcohol or use drugs. Current Outpatient Prescriptions on File Prior to Visit  Medication Sig Dispense Refill  . HYDROcodone-acetaminophen (NORCO/VICODIN) 5-325 MG tablet Take 1 tablet by mouth every 6 (six) hours as needed for moderate pain. (Patient not taking: Reported on 06/25/2016) 5 tablet 0  . naproxen (NAPROSYN) 500 MG tablet Take 1 tablet (500 mg total) by mouth 2 (two) times daily with a meal. (Patient not taking: Reported on 06/24/2016) 60 tablet 0  . ondansetron (ZOFRAN ODT) 4 MG disintegrating tablet Take 1 tablet (4 mg total) by mouth every 6 (six) hours as needed for nausea or vomiting. (Patient not taking: Reported on 06/24/2016) 15 tablet 0  . oxyCODONE-acetaminophen (ROXICET) 5-325 MG tablet Take 1-2 tablets by mouth every 4 (four) hours as needed for severe pain. (Patient not taking:  Reported on 06/24/2016) 15 tablet 0   No current facility-administered medications on file prior to visit.          Review of Systems:  Review of Systems  Constitutional: Denied constitutional symptoms, night sweats, recent illness, fatigue, fever, insomnia and weight loss.  Eyes: Denied eye symptoms, eye pain, photophobia, vision change and visual disturbance.  Ears/Nose/Throat/Neck: Denied ear, nose, throat or neck symptoms, hearing loss, nasal discharge, sinus congestion and sore throat.  Cardiovascular: Denied cardiovascular symptoms, arrhythmia, chest pain/pressure, edema, exercise intolerance, orthopnea and palpitations.  Respiratory: Denied pulmonary symptoms, asthma, pleuritic pain, productive sputum, cough, dyspnea and wheezing.  Gastrointestinal: Denied, gastro-esophageal reflux, melena, nausea and vomiting.  Genitourinary: See HPI for additional information.  Musculoskeletal: Denied musculoskeletal symptoms, stiffness, swelling, muscle weakness and myalgia.  Dermatologic: Denied dermatology symptoms, rash and scar.  Neurologic: Denied neurology symptoms, dizziness, headache, neck pain and syncope.  Psychiatric: Denied psychiatric symptoms, anxiety and depression.  Endocrine: Denied endocrine symptoms including hot flashes and night sweats.   Meds:   Current Outpatient Prescriptions on File Prior to Visit  Medication Sig Dispense Refill  . HYDROcodone-acetaminophen (NORCO/VICODIN) 5-325 MG tablet Take 1 tablet by mouth every 6 (six) hours as needed for moderate pain. (Patient not taking: Reported on 06/25/2016) 5 tablet 0  . naproxen (NAPROSYN) 500 MG tablet Take 1 tablet (500 mg total) by mouth 2 (two) times daily with a meal. (Patient not taking: Reported on 06/24/2016) 60 tablet 0  . ondansetron (ZOFRAN ODT) 4 MG  disintegrating tablet Take 1 tablet (4 mg total) by mouth every 6 (six) hours as needed for nausea or vomiting. (Patient not taking: Reported on 06/24/2016) 15 tablet 0   . oxyCODONE-acetaminophen (ROXICET) 5-325 MG tablet Take 1-2 tablets by mouth every 4 (four) hours as needed for severe pain. (Patient not taking: Reported on 06/24/2016) 15 tablet 0   No current facility-administered medications on file prior to visit.     Objective:     Vitals:   06/25/16 1018  BP: (!) 146/80  Pulse: 73              Ultrasound results reviewed directly with the patient uterine fibroids and endometrium discussed in detail.    Assessment:    G2P2002 There are no active problems to display for this patient.    1. Pelvic pain in female   2. Fibroids, intramural     Pain likely secondary to uterine fibroids as it began significantly worse prior to her last menstrual period which was the first week of this month. Her bleeding has now stopped.  Irregular cycles likely consistent with climacteric.     Plan:            1.  Determination of menopause is the key to further workup. With elevated [place] patient will need endometrial biopsy, but expect uterine fibroids to rapidly diminished in size with a decrease in associated symptoms. If [place] normal endometrium not an issue but management of fibroids temporized until menopause the most likely scenario.    Orders Orders Placed This Encounter  Procedures  . Follicle stimulating hormone          F/U  Return in about 1 week (around 07/02/2016). I spent 32 minutes with this patient of which greater than 50% was spent discussing endometrial fibroids, neck and endometrium, menopause versus perimenopause etc.  Finis Bud, M.D. 06/25/2016 10:54 AM

## 2016-06-26 LAB — FOLLICLE STIMULATING HORMONE: FSH: 8.9 m[IU]/mL

## 2016-06-30 ENCOUNTER — Telehealth: Payer: Self-pay | Admitting: Obstetrics and Gynecology

## 2016-07-01 ENCOUNTER — Encounter: Payer: Self-pay | Admitting: Obstetrics and Gynecology

## 2016-07-01 ENCOUNTER — Ambulatory Visit (INDEPENDENT_AMBULATORY_CARE_PROVIDER_SITE_OTHER): Payer: BLUE CROSS/BLUE SHIELD | Admitting: Obstetrics and Gynecology

## 2016-07-01 VITALS — BP 123/76 | HR 87 | Ht 69.0 in | Wt 239.2 lb

## 2016-07-01 DIAGNOSIS — D251 Intramural leiomyoma of uterus: Secondary | ICD-10-CM

## 2016-07-01 DIAGNOSIS — N951 Menopausal and female climacteric states: Secondary | ICD-10-CM

## 2016-07-01 NOTE — Progress Notes (Signed)
HPI:      Teresa Manning is a 51 y.o. [id number] who LMP was Patient's last menstrual period was 06/17/2016 (exact date).  Subjective:   She presents today For follow-up of her pelvic pain, irregular menses, and FSH. She says that her pain is resolved but she has not attempted intercourse yet. She is no longer bleeding.    Hx: The following portions of the patient's history were reviewed and updated as appropriate:              She  has no past medical history on file. She  does not have a problem list on file. She  has a past surgical history that includes Colposcopy. Her family history includes Diabetes in her mother; Hyperlipidemia in her mother; Hypertension in her mother. She  reports that she has been smoking Cigarettes.  She has been smoking about 1.00 pack per day. She has never used smokeless tobacco. She reports that she does not drink alcohol or use drugs. No current outpatient prescriptions on file prior to visit.   No current facility-administered medications on file prior to visit.          Review of Systems:  Review of Systems  Constitutional: Denied constitutional symptoms, night sweats, recent illness, fatigue, fever, insomnia and weight loss.  Eyes: Denied eye symptoms, eye pain, photophobia, vision change and visual disturbance.  Ears/Nose/Throat/Neck: Denied ear, nose, throat or neck symptoms, hearing loss, nasal discharge, sinus congestion and sore throat.  Cardiovascular: Denied cardiovascular symptoms, arrhythmia, chest pain/pressure, edema, exercise intolerance, orthopnea and palpitations.  Respiratory: Denied pulmonary symptoms, asthma, pleuritic pain, productive sputum, cough, dyspnea and wheezing.  Gastrointestinal: Denied, gastro-esophageal reflux, melena, nausea and vomiting.  Genitourinary: Denied genitourinary symptoms including symptomatic vaginal discharge, pelvic relaxation issues, and urinary complaints.  Musculoskeletal: Denied musculoskeletal  symptoms, stiffness, swelling, muscle weakness and myalgia.  Dermatologic: Denied dermatology symptoms, rash and scar.  Neurologic: Denied neurology symptoms, dizziness, headache, neck pain and syncope.  Psychiatric: Denied psychiatric symptoms, anxiety and depression.  Endocrine: Denied endocrine symptoms including hot flashes and night sweats.   Meds:   No current outpatient prescriptions on file prior to visit.   No current facility-administered medications on file prior to visit.     Objective:     Vitals:   07/01/16 0931  BP: 123/76  Pulse: 87                Assessment:    G2P2002 There are no active problems to display for this patient.    1. Climacteric   2. Fibroids, intramural     Patient with non-menopausal FSH. I thus attributed her irregular bleeding to climacteric and suspect that her pelvic pain has to do with uterine fibroids. She is probably ovulating irregularly resulting in irregular menses.  Plan:            1.   I have discussed medical management of her menses using progesterone. The risks and benefits were discussed. We have discussed the use of NSAIDs for pelvic cramping and cramping with intercourse. She has declined progesterone cycling. At this time she will just employ expectant management thinking that menopause will occur shortly and her bleeding and symptoms from uterine fibroids will resolve with menopause. She has been instructed that if her bleeding occurs more than 1 time per month and is heavy she may need follow-up and endometrial biopsy. We have also discussed the possibility of hysterectomy for uterine fibroids and pelvic pain but would attempt medical  management prior to that.       F/U  Return for Pt to contact us if symptoms worsen. I spent 17 minutes with this patient of which greater than 50% was spent discussing menopause, irregular bleeding, climacteric, uterine fibroids, multiple management schemes risks and  benefits.      Finis Bud, M.D. 07/01/2016 11:02 AM

## 2016-07-01 NOTE — Telephone Encounter (Signed)
error 

## 2016-07-02 NOTE — Telephone Encounter (Signed)
ERROR

## 2017-04-09 ENCOUNTER — Other Ambulatory Visit: Payer: Self-pay

## 2017-04-09 ENCOUNTER — Encounter: Payer: Self-pay | Admitting: Emergency Medicine

## 2017-04-09 ENCOUNTER — Emergency Department
Admission: EM | Admit: 2017-04-09 | Discharge: 2017-04-09 | Disposition: A | Discharge status: Home / Self Care | Payer: No Typology Code available for payment source | Attending: Emergency Medicine | Admitting: Emergency Medicine

## 2017-04-09 DIAGNOSIS — Y999 Unspecified external cause status: Secondary | ICD-10-CM | POA: Insufficient documentation

## 2017-04-09 DIAGNOSIS — F17210 Nicotine dependence, cigarettes, uncomplicated: Secondary | ICD-10-CM | POA: Diagnosis not present

## 2017-04-09 DIAGNOSIS — Y92410 Unspecified street and highway as the place of occurrence of the external cause: Secondary | ICD-10-CM | POA: Diagnosis not present

## 2017-04-09 DIAGNOSIS — R110 Nausea: Secondary | ICD-10-CM | POA: Insufficient documentation

## 2017-04-09 DIAGNOSIS — Y9389 Activity, other specified: Secondary | ICD-10-CM | POA: Insufficient documentation

## 2017-04-09 DIAGNOSIS — R51 Headache: Secondary | ICD-10-CM | POA: Diagnosis not present

## 2017-04-09 DIAGNOSIS — M542 Cervicalgia: Secondary | ICD-10-CM | POA: Insufficient documentation

## 2017-04-09 DIAGNOSIS — S199XXA Unspecified injury of neck, initial encounter: Secondary | ICD-10-CM | POA: Diagnosis present

## 2017-04-09 MED ORDER — IBUPROFEN 600 MG PO TABS: 600.0000 mg | ORAL_TABLET | Freq: Four times a day (QID) | ORAL | 0 refills | Status: DC | PRN

## 2017-04-09 MED ORDER — CYCLOBENZAPRINE HCL 5 MG PO TABS: ORAL_TABLET | ORAL | 0 refills | Status: DC

## 2017-04-09 MED ORDER — ONDANSETRON HCL 4 MG PO TABS: 4.0000 mg | ORAL_TABLET | Freq: Every day | ORAL | 0 refills | Status: AC | PRN
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
## 2017-06-07 ENCOUNTER — Other Ambulatory Visit: Payer: Self-pay

## 2017-06-07 ENCOUNTER — Emergency Department
Admission: EM | Admit: 2017-06-07 | Discharge: 2017-06-07 | Disposition: A | Discharge status: Home / Self Care | Payer: Self-pay | Attending: Emergency Medicine | Admitting: Emergency Medicine

## 2017-06-07 DIAGNOSIS — J020 Streptococcal pharyngitis: Secondary | ICD-10-CM | POA: Insufficient documentation

## 2017-06-07 DIAGNOSIS — F17210 Nicotine dependence, cigarettes, uncomplicated: Secondary | ICD-10-CM | POA: Insufficient documentation

## 2017-06-07 LAB — GROUP A STREP BY PCR: Group A Strep by PCR: DETECTED — AB

## 2017-06-07 LAB — INFLUENZA PANEL BY PCR (TYPE A & B)
Influenza A By PCR: NEGATIVE
Influenza B By PCR: NEGATIVE

## 2017-06-07 MED ORDER — IBUPROFEN 800 MG PO TABS
800.0000 mg | ORAL_TABLET | Freq: Once | ORAL | Status: AC
Start: 2017-06-07 — End: 2017-06-07
  Administered 2017-06-07: 800 mg via ORAL
  Filled 2017-06-07: qty 1

## 2017-06-07 MED ORDER — AZITHROMYCIN 250 MG PO TABS: ORAL_TABLET | ORAL | 0 refills | Status: DC

## 2017-06-07 MED ORDER — AZITHROMYCIN 500 MG PO TABS
500.0000 mg | ORAL_TABLET | Freq: Once | ORAL | Status: AC
  Administered 2017-06-07: 500 mg via ORAL
  Filled 2017-06-07: qty 1

## 2017-06-07 MED ORDER — FLUCONAZOLE 150 MG PO TABS: ORAL_TABLET | ORAL | 0 refills | Status: DC

## 2017-06-07 NOTE — ED Notes (Signed)
Pt to the ER for sore throat and nausea, body aches and fever. Pt taking otc meds at home with no relief.

## 2017-06-07 NOTE — ED Provider Notes (Signed)
[facility] Emergency Department Provider Note  ____________________________________________   First MD Initiated Contact with Patient 06/07/17 2214     (approximate)  I have reviewed the triage vital signs and the nursing notes.   HISTORY  Chief Complaint Sore Throat and Generalized Body Aches    HPI Teresa Manning is a 52 y.o. female presents emergency department complaining of sore throat, body aches and fever that started last night.  She states her granddaughter was just diagnosed with strep throat.  She denies any vomiting or diarrhea.  She states she has a mild cough  History reviewed. No pertinent past medical history.  There are no active problems to display for this patient.   Past Surgical History:  Procedure Laterality Date  . COLPOSCOPY      Prior to Admission medications   Medication Sig Start Date End Date Taking? Authorizing Provider  azithromycin (ZITHROMAX Z-PAK) 250 MG tablet 1 pill per day for 4 days (loading dose given in ER) 06/07/17   Steve Gregg, Linden Dolin, PA-C  cyclobenzaprine (FLEXERIL) 5 MG tablet Take 1-2 tablets 3 times daily as needed 04/09/17   Laban Emperor, PA-C  fluconazole (DIFLUCAN) 150 MG tablet Take one now and one in a week 06/07/17   Caryn Section, Linden Dolin, PA-C  ibuprofen (ADVIL,MOTRIN) 600 MG tablet Take 1 tablet (600 mg total) by mouth every 6 (six) hours as needed. 04/09/17   Laban Emperor, PA-C  ondansetron (ZOFRAN) 4 MG tablet Take 1 tablet (4 mg total) by mouth daily as needed for nausea or vomiting. 04/09/17 04/09/18  Laban Emperor, PA-C    Allergies Patient has no known allergies.  Family History  Problem Relation Age of Onset  . Diabetes Mother   . Hypertension Mother   . Hyperlipidemia Mother     Social History Social History   Tobacco Use  . Smoking status: Current Every Day Smoker    Packs/day: 1.00    Types: Cigarettes  . Smokeless tobacco: Never Used  Substance Use Topics  . Alcohol use: No    . Drug use: No    Review of Systems  Constitutional: Positive fever/chills Eyes: No visual changes. ENT: Positive sore throat. Respiratory: Positive cough Gastrointestinal: Denies vomiting or diarrhea Genitourinary: Negative for dysuria. Musculoskeletal: Negative for back pain. Skin: Negative for rash.    ____________________________________________   PHYSICAL EXAM:  VITAL SIGNS: ED Triage Vitals  Enc Vitals Group     BP 06/07/17 2154 137/74     Pulse Rate 06/07/17 2154 94     Resp 06/07/17 2154 18     Temp 06/07/17 2154 (!) 102.5 F (39.2 C)     Temp Source 06/07/17 2154 Oral     SpO2 06/07/17 2154 97 %     Weight 06/07/17 2155 228 lb (103.4 kg)     Height 06/07/17 2155 5\' 5"  (1.651 m)     Head Circumference --      Peak Flow --      Pain Score --      Pain Loc --      Pain Edu? --      Excl. in [place]? --     Constitutional: Alert and oriented. Well appearing and in no acute distress. Eyes: Conjunctivae are normal.  Head: Atraumatic. Ears: TMs are clear bilaterally Nose: No congestion/rhinnorhea. Mouth/Throat: Mucous membranes are moist.  Throat is bright red and swollen Neck: Is supple, there is lymphadenopathy in the upper cervical chain Cardiovascular: Normal rate, regular rhythm.  Sounds  are normal Respiratory: Normal respiratory effort.  No retractions, lungs clear to auscultation GU: deferred Musculoskeletal: FROM all extremities, warm and well perfused Neurologic:  Normal speech and language.  Skin:  Skin is warm, dry and intact. No rash noted. Psychiatric: Mood and affect are normal. Speech and behavior are normal.  ____________________________________________   LABS (all labs ordered are listed, but only abnormal results are displayed)  Labs Reviewed  GROUP A STREP BY PCR - Abnormal; Notable for the following components:      Result Value   Group A Strep by PCR DETECTED (*)    All other components within normal limits  INFLUENZA PANEL BY PCR  (TYPE A & B)   ____________________________________________   ____________________________________________  RADIOLOGY    ____________________________________________   PROCEDURES  Procedure(s) performed: No  Procedures    ____________________________________________   INITIAL IMPRESSION / ASSESSMENT AND PLAN / ED COURSE  Pertinent labs & imaging results that were available during my care of the patient were reviewed by me and considered in my medical decision making (see chart for details).  Patient is a 52 year old female presented to the emergency department with fever, sore throat and body aches.  On physical exam the patient appears febrile.  Throat is bright red and swollen.  Remainder of the exam is benign  Strep test and flu test ordered Patient was given ibuprofen 800 mg p.o.    ----------------------------------------- 11:40 PM on 06/07/2017 -----------------------------------------  Strep test is positive, influenza test negative  Test results were explained to the patient.  She states she cannot take amoxicillin.  Zithromax 500 mg was given in the ED.  She is given a prescription for Zithromax 250 mg daily for 4 days, Diflucan as needed for yeast.  She is to follow-up with her regular doctor if not better in 3 days.  Gargle with warm salt water.  Take Tylenol and ibuprofen as needed for fever.  She states she understands.  She is discharged in stable condition  As part of my medical decision making, I reviewed the following data within the [place] notes reviewed and incorporated, Labs reviewed strep test positive flu test negative, Notes from prior ED visits and [place] Controlled Substance Database  ____________________________________________   FINAL CLINICAL IMPRESSION(S) / ED DIAGNOSES  Final diagnoses:  Acute streptococcal pharyngitis      NEW MEDICATIONS STARTED DURING THIS VISIT:  New Prescriptions   AZITHROMYCIN  (ZITHROMAX Z-PAK) 250 MG TABLET    1 pill per day for 4 days (loading dose given in ER)   FLUCONAZOLE (DIFLUCAN) 150 MG TABLET    Take one now and one in a week     Note:  This document was prepared using Dragon voice recognition software and may include unintentional dictation errors.    Versie Starks, PA-C 06/07/17 2341    Schuyler Amor, MD 06/18/17 [phone]

## 2017-06-07 NOTE — ED Triage Notes (Signed)
Pt arrives to ED via POV from home with c/o sore throat, body aches and fever x2 days. Pt reports (+) nausea, but no V/D. Pt reports taking OTC NiteTime Cold PTA.

## 2017-06-28 ENCOUNTER — Emergency Department
Admission: EM | Admit: 2017-06-28 | Discharge: 2017-06-28 | Disposition: A | Discharge status: Home / Self Care | Payer: Self-pay | Attending: Emergency Medicine | Admitting: Emergency Medicine

## 2017-06-28 ENCOUNTER — Emergency Department
Admission: EM | Admit: 2017-06-28 | Discharge: 2017-06-29 | Disposition: A | Discharge status: Home / Self Care | Payer: Self-pay | Attending: Emergency Medicine | Admitting: Emergency Medicine

## 2017-06-28 ENCOUNTER — Encounter: Payer: Self-pay | Admitting: Emergency Medicine

## 2017-06-28 ENCOUNTER — Encounter: Payer: Self-pay | Admitting: Medical Oncology

## 2017-06-28 DIAGNOSIS — F17210 Nicotine dependence, cigarettes, uncomplicated: Secondary | ICD-10-CM | POA: Insufficient documentation

## 2017-06-28 DIAGNOSIS — Z5321 Procedure and treatment not carried out due to patient leaving prior to being seen by health care provider: Secondary | ICD-10-CM | POA: Insufficient documentation

## 2017-06-28 DIAGNOSIS — N390 Urinary tract infection, site not specified: Secondary | ICD-10-CM | POA: Insufficient documentation

## 2017-06-28 DIAGNOSIS — R102 Pelvic and perineal pain: Secondary | ICD-10-CM | POA: Insufficient documentation

## 2017-06-28 DIAGNOSIS — Z79899 Other long term (current) drug therapy: Secondary | ICD-10-CM | POA: Insufficient documentation

## 2017-06-28 DIAGNOSIS — R1031 Right lower quadrant pain: Secondary | ICD-10-CM | POA: Insufficient documentation

## 2017-06-28 LAB — CBC WITH DIFFERENTIAL/PLATELET
BASOS ABS: 0 10*3/uL (ref 0–0.1)
BASOS PCT: 0 %
EOS PCT: 2 %
Eosinophils Absolute: 0.1 10*3/uL (ref 0–0.7)
HCT: 40.7 % (ref 35.0–47.0)
Hemoglobin: 13.7 g/dL (ref 12.0–16.0)
Lymphocytes Relative: 43 %
Lymphs Abs: 2.7 10*3/uL (ref 1.0–3.6)
MCH: 29.5 pg (ref 26.0–34.0)
MCHC: 33.7 g/dL (ref 32.0–36.0)
MCV: 87.5 fL (ref 80.0–100.0)
Monocytes Absolute: 0.4 10*3/uL (ref 0.2–0.9)
Monocytes Relative: 7 %
NEUTROS PCT: 48 %
Neutro Abs: 3.1 10*3/uL (ref 1.4–6.5)
PLATELETS: 230 10*3/uL (ref 150–440)
RBC: 4.65 MIL/uL (ref 3.80–5.20)
RDW: 13.7 % (ref 11.5–14.5)
WBC: 6.4 10*3/uL (ref 3.6–11.0)

## 2017-06-28 LAB — BASIC METABOLIC PANEL
Anion gap: 4 — ABNORMAL LOW (ref 5–15)
BUN: 9 mg/dL (ref 6–20)
CO2: 27 mmol/L (ref 22–32)
Calcium: 8.9 mg/dL (ref 8.9–10.3)
Chloride: 106 mmol/L (ref 101–111)
Creatinine, Ser: 0.66 mg/dL (ref 0.44–1.00)
GFR calc non Af Amer: >60 mL/min (ref >60)
Glucose, Bld: 86 mg/dL (ref 65–99)
POTASSIUM: 3.9 mmol/L (ref 3.5–5.1)
SODIUM: 137 mmol/L (ref 135–145)

## 2017-06-28 LAB — URINALYSIS, COMPLETE (UACMP) WITH MICROSCOPIC
Bilirubin Urine: NEGATIVE
GLUCOSE, UA: NEGATIVE mg/dL
Hgb urine dipstick: NEGATIVE
KETONES UR: NEGATIVE mg/dL
Leukocytes, UA: NEGATIVE
NITRITE: NEGATIVE
PH: 5 (ref 5.0–8.0)
Protein, ur: 30 mg/dL — AB
Specific Gravity, Urine: 1.026 (ref 1.005–1.030)

## 2017-06-28 MED ORDER — HYDROCODONE-ACETAMINOPHEN 5-325 MG PO TABS
1.0000 | ORAL_TABLET | Freq: Once | ORAL | Status: AC
  Administered 2017-06-28: 1 via ORAL
  Filled 2017-06-28: qty 1

## 2017-06-28 MED ORDER — HYDROCODONE-ACETAMINOPHEN 5-325 MG PO TABS: 1.0000 | ORAL_TABLET | Freq: Four times a day (QID) | ORAL | 0 refills | Status: AC | PRN

## 2017-06-28 MED ORDER — IBUPROFEN 600 MG PO TABS
600.0000 mg | ORAL_TABLET | Freq: Once | ORAL | Status: AC
  Administered 2017-06-28: 600 mg via ORAL
  Filled 2017-06-28: qty 1

## 2017-06-28 MED ORDER — CEPHALEXIN 500 MG PO CAPS: 500.0000 mg | ORAL_CAPSULE | Freq: Two times a day (BID) | ORAL | 0 refills | Status: AC

## 2017-06-28 MED ORDER — IBUPROFEN 600 MG PO TABS: 600.0000 mg | ORAL_TABLET | Freq: Four times a day (QID) | ORAL | 0 refills | Status: DC | PRN

## 2017-06-28 NOTE — ED Triage Notes (Signed)
Pt c/o rt sided pelvic pain that began last night, denies bleeding. Pt also reports rt leg pain/numbness.

## 2017-06-28 NOTE — ED Notes (Signed)
Reviewed discharge instructions, follow-up care, and prescriptions with patient. Patient verbalized understanding of all information reviewed.

## 2017-06-28 NOTE — ED Notes (Signed)
ED Provider at bedside. 

## 2017-06-28 NOTE — ED Provider Notes (Signed)
[facility] Emergency Department Provider Note ____________________________________________   First MD Initiated Contact with Patient 06/28/17 2201     (approximate)  I have reviewed the triage vital signs and the nursing notes.   HISTORY  Chief Complaint Pelvic Pain    HPI Teresa Manning is a 52 y.o. female with PMH as noted below who presents with pelvic/right groin pain since last night, acute onset, worsening course today, and worse with movement of the right leg.  No prior history of this pain.  No associated nausea, vomiting, diarrhea, or fever.  No vaginal bleeding, discharge, or urinary symptoms.   History reviewed. No pertinent past medical history.  There are no active problems to display for this patient.   Past Surgical History:  Procedure Laterality Date  . COLPOSCOPY      Prior to Admission medications   Medication Sig Start Date End Date Taking? Authorizing Provider  azithromycin (ZITHROMAX Z-PAK) 250 MG tablet 1 pill per day for 4 days (loading dose given in ER) 06/07/17   Fisher, Linden Dolin, PA-C  cyclobenzaprine (FLEXERIL) 5 MG tablet Take 1-2 tablets 3 times daily as needed 04/09/17   Laban Emperor, PA-C  fluconazole (DIFLUCAN) 150 MG tablet Take one now and one in a week 06/07/17   Caryn Section, Linden Dolin, PA-C  HYDROcodone-acetaminophen (NORCO/VICODIN) 5-325 MG tablet Take 1 tablet by mouth every 6 (six) hours as needed for up to 5 days for severe pain. 06/28/17 07/03/17  Arta Silence, MD  ibuprofen (ADVIL,MOTRIN) 600 MG tablet Take 1 tablet (600 mg total) by mouth every 6 (six) hours as needed. 06/28/17   Arta Silence, MD  ondansetron (ZOFRAN) 4 MG tablet Take 1 tablet (4 mg total) by mouth daily as needed for nausea or vomiting. 04/09/17 04/09/18  Laban Emperor, PA-C    Allergies Patient has no known allergies.  Family History  Problem Relation Age of Onset  . Diabetes Mother   . Hypertension Mother   . Hyperlipidemia  Mother     Social History Social History   Tobacco Use  . Smoking status: Current Every Day Smoker    Packs/day: 1.00    Types: Cigarettes  . Smokeless tobacco: Never Used  Substance Use Topics  . Alcohol use: No  . Drug use: No    Review of Systems  Constitutional: No fever. Eyes: No redness. ENT: No sore throat. Cardiovascular: Denies chest pain. Respiratory: Denies shortness of breath. Gastrointestinal: No nausea, no vomiting.   Genitourinary: Negative for dysuria.  Musculoskeletal: Negative for back pain. Skin: Negative for rash. Neurological: Negative for headaches, focal weakness or numbness.    ____________________________________________   PHYSICAL EXAM:  VITAL SIGNS: ED Triage Vitals [06/28/17 1914]  Enc Vitals Group     BP 125/79     Pulse Rate 84     Resp 17     Temp 98.1 F (36.7 C)     Temp Source Oral     SpO2 99 %     Weight 228 lb (103.4 kg)     Height      Head Circumference      Peak Flow      Pain Score 10     Pain Loc      Pain Edu?      Excl. in [place]?     Constitutional: Alert and oriented. Well appearing and in no acute distress. Eyes: Conjunctivae are normal.  Head: Atraumatic. Nose: No congestion/rhinnorhea. Mouth/Throat: Mucous membranes are moist.   Neck:  Normal range of motion.  Cardiovascular:Good peripheral circulation. Respiratory: Normal respiratory effort.  No retractions.  Gastrointestinal: Soft and nontender. No distention.  Genitourinary: Normal external genitalia.  Mild tenderness to right inguinal crease.  No palpable masses or fluctuance.  No lymphadenopathy. Musculoskeletal: No lower extremity edema.  Extremities warm and well perfused.  No erythema, induration, swelling, or tenderness to the proximal right thigh. Neurologic:  Normal speech and language. No gross focal neurologic deficits are appreciated.  5/5 motor strength and intact sensation in bilateral lower extremities. Skin:  Skin is warm and dry. No rash  noted. Psychiatric: Mood and affect are normal. Speech and behavior are normal.  ____________________________________________   LABS (all labs ordered are listed, but only abnormal results are displayed)  Labs Reviewed  URINALYSIS, COMPLETE (UACMP) WITH MICROSCOPIC - Abnormal; Notable for the following components:      Result Value   Color, Urine AMBER (*)    APPearance CLOUDY (*)    Protein, ur 30 (*)    Bacteria, UA MANY (*)    Squamous Epithelial / LPF 6-30 (*)    All other components within normal limits   ____________________________________________  EKG   ____________________________________________  RADIOLOGY    ____________________________________________   PROCEDURES  Procedure(s) performed: No  Procedures  Critical Care performed: No ____________________________________________   INITIAL IMPRESSION / ASSESSMENT AND PLAN / ED COURSE  Pertinent labs & imaging results that were available during my care of the patient were reviewed by me and considered in my medical decision making (see chart for details).  53 year old female with PMH as noted above presents with right inguinal area pain over the last day.  No prior history of this, no significant associated symptoms.  I reviewed the past medical records in Epic; patient has had multiple prior ED visits, but none for this complaint.  On exam, the patient is extremely well-appearing, vital signs are normal, and the remainder the exam is as described above.  The patient has mild tenderness in the right inguinal area but no palpable lymphadenopathy, cutaneous lesions, rash, or tenderness to the proximal thigh.  Full range of motion at the right hip.  She declined pelvic exam, however she denies discharge or bleeding.  She understands that without performing pelvic exam I cannot fully evaluate for possible serious causes of the pain.  Differential includes inguinal muscle strain, ligament or tendon injury,  lymphadenopathy (although no palpable nodes), or hernia given that the patient states the pain is worse with coughing or straining.  No palpable incarcerated hernia is present, and the patient reports normal bowel movements and no nausea or vomiting.  I will obtain a UA to rule out UTI/cystitis, however if negative the patient will be stable for discharge home.  Plan will be analgesia, and outpatient follow-up.    ----------------------------------------- 11:33 PM on 06/28/2017 -----------------------------------------  UA showed significant WBCs, which could be suggestive of UTI.  I will treat for UTI, and give analgesia.  Patient has been provided referrals to OB/GYN and general surgery for further workup.  The patient feels well to go home.  Return precautions given, and she expresses understanding. ____________________________________________   FINAL CLINICAL IMPRESSION(S) / ED DIAGNOSES  Final diagnoses:  Right inguinal pain  Urinary tract infection without hematuria, site unspecified      NEW MEDICATIONS STARTED DURING THIS VISIT:  New Prescriptions   HYDROCODONE-ACETAMINOPHEN (NORCO/VICODIN) 5-325 MG TABLET    Take 1 tablet by mouth every 6 (six) hours as needed for up to 5 days  for severe pain.   IBUPROFEN (ADVIL,MOTRIN) 600 MG TABLET    Take 1 tablet (600 mg total) by mouth every 6 (six) hours as needed.     Note:  This document was prepared using Dragon voice recognition software and may include unintentional dictation errors.    Arta Silence, MD 06/28/17 [phone]

## 2017-06-28 NOTE — ED Triage Notes (Signed)
Pt c/o right side of pelvic area starting last night with worsening pain throughout today. Pt was in ED earlier today, had labs drawn but left without being seen. Pt back for evaluation.

## 2017-06-28 NOTE — ED Notes (Signed)
Pt c/o pain to right groin. No swelling, discoloration, fever noted to area

## 2017-06-28 NOTE — Discharge Instructions (Addendum)
Your pain is possibly due to a hernia, but could be related to several other causes.  You may take the pain medication as needed (take the ibuprofen as a baseline, and you may take the Vicodin if needed for more severe pain).  Do not drive or operate machinery while taking Vicodin.  We have provided referrals both for OB/GYN, as well as for a general surgeon for you to follow-up with.  Return to the emergency department for new, worsening, or persistent severe pain, vomiting, not making bowel movements or passing gas, vaginal bleeding or discharge, fevers, weakness, or any other new or worsening symptoms that concern you.

## 2017-08-05 ENCOUNTER — Encounter: Payer: Self-pay | Admitting: Emergency Medicine

## 2017-08-05 ENCOUNTER — Other Ambulatory Visit: Payer: Self-pay

## 2017-08-05 ENCOUNTER — Emergency Department
Admission: EM | Admit: 2017-08-05 | Discharge: 2017-08-05 | Disposition: A | Discharge status: Home / Self Care | Payer: Self-pay | Attending: Emergency Medicine | Admitting: Emergency Medicine

## 2017-08-05 ENCOUNTER — Emergency Department: Payer: Self-pay

## 2017-08-05 DIAGNOSIS — Z23 Encounter for immunization: Secondary | ICD-10-CM | POA: Insufficient documentation

## 2017-08-05 DIAGNOSIS — S91332A Puncture wound without foreign body, left foot, initial encounter: Secondary | ICD-10-CM | POA: Insufficient documentation

## 2017-08-05 DIAGNOSIS — W450XXA Nail entering through skin, initial encounter: Secondary | ICD-10-CM | POA: Insufficient documentation

## 2017-08-05 DIAGNOSIS — Y9301 Activity, walking, marching and hiking: Secondary | ICD-10-CM | POA: Insufficient documentation

## 2017-08-05 DIAGNOSIS — Y999 Unspecified external cause status: Secondary | ICD-10-CM | POA: Insufficient documentation

## 2017-08-05 DIAGNOSIS — F17210 Nicotine dependence, cigarettes, uncomplicated: Secondary | ICD-10-CM | POA: Insufficient documentation

## 2017-08-05 DIAGNOSIS — Y929 Unspecified place or not applicable: Secondary | ICD-10-CM | POA: Insufficient documentation

## 2017-08-05 MED ORDER — HYDROCODONE-ACETAMINOPHEN 5-325 MG PO TABS
1.0000 | ORAL_TABLET | Freq: Once | ORAL | Status: AC
  Administered 2017-08-05: 1 via ORAL
  Filled 2017-08-05: qty 1

## 2017-08-05 MED ORDER — NAPROXEN 500 MG PO TABS
500.0000 mg | ORAL_TABLET | Freq: Once | ORAL | Status: AC
Start: 2017-08-05 — End: 2017-08-05
  Administered 2017-08-05: 500 mg via ORAL
  Filled 2017-08-05: qty 1

## 2017-08-05 MED ORDER — TETANUS-DIPHTH-ACELL PERTUSSIS 5-2.5-18.5 LF-MCG/0.5 IM SUSP
0.5000 mL | Freq: Once | INTRAMUSCULAR | Status: AC
  Administered 2017-08-05: 0.5 mL via INTRAMUSCULAR
  Filled 2017-08-05: qty 0.5

## 2017-08-05 MED ORDER — LEVOFLOXACIN 500 MG PO TABS: 500.0000 mg | ORAL_TABLET | Freq: Every day | ORAL | 0 refills | Status: AC

## 2017-08-05 MED ORDER — NAPROXEN 500 MG PO TABS: 500.0000 mg | ORAL_TABLET | Freq: Two times a day (BID) | ORAL | 0 refills | Status: DC

## 2017-08-05 MED ORDER — TRAMADOL HCL 50 MG PO TABS: 50.0000 mg | ORAL_TABLET | Freq: Four times a day (QID) | ORAL | 0 refills | Status: DC | PRN

## 2017-08-05 MED ORDER — CEPHALEXIN 500 MG PO CAPS: 500.0000 mg | ORAL_CAPSULE | Freq: Two times a day (BID) | ORAL | 0 refills | Status: AC

## 2017-08-05 NOTE — ED Triage Notes (Signed)
states she stepped on a nail  Went thur work shoe  Puncture wound noted to left foot

## 2017-08-05 NOTE — ED Provider Notes (Signed)
[facility] Emergency Department Provider Note  ____________________________________________  Time seen: Approximately 5:16 PM  I have reviewed the triage vital signs and the nursing notes.   HISTORY  Chief Complaint Puncture Wound   HPI Teresa Manning is a 52 y.o. female presents to the emergency department for treatment and evaluation after stepping on a nail.  Patient states that she was wearing her work boots with rubber soles and stepped on a board with an exposed nail.  She states that she immediately pulled her foot away from the board and then removed her shoe.  She is unsure of her last tetanus vaccination.  She states that the foot is very painful at this time.  History reviewed. No pertinent past medical history.  There are no active problems to display for this patient.   Past Surgical History:  Procedure Laterality Date  . COLPOSCOPY      Prior to Admission medications   Medication Sig Start Date End Date Taking? Authorizing Provider  azithromycin (ZITHROMAX Z-PAK) 250 MG tablet 1 pill per day for 4 days (loading dose given in ER) 06/07/17   Fisher, Linden Dolin, PA-C  cephALEXin (KEFLEX) 500 MG capsule Take 1 capsule (500 mg total) by mouth 2 (two) times daily for 10 days. 08/05/17 08/15/17  Victorino Dike, FNP  cyclobenzaprine (FLEXERIL) 5 MG tablet Take 1-2 tablets 3 times daily as needed 04/09/17   Laban Emperor, PA-C  fluconazole (DIFLUCAN) 150 MG tablet Take one now and one in a week 06/07/17   Caryn Section, Linden Dolin, PA-C  ibuprofen (ADVIL,MOTRIN) 600 MG tablet Take 1 tablet (600 mg total) by mouth every 6 (six) hours as needed. 06/28/17   Arta Silence, MD  levofloxacin (LEVAQUIN) 500 MG tablet Take 1 tablet (500 mg total) by mouth daily for 7 days. 08/05/17 08/12/17  Serafin Decatur, Johnette Abraham B, FNP  naproxen (NAPROSYN) 500 MG tablet Take 1 tablet (500 mg total) by mouth 2 (two) times daily with a meal. 08/05/17   Ethan Kasperski B, FNP  ondansetron (ZOFRAN)  4 MG tablet Take 1 tablet (4 mg total) by mouth daily as needed for nausea or vomiting. 04/09/17 04/09/18  Laban Emperor, PA-C  traMADol (ULTRAM) 50 MG tablet Take 1 tablet (50 mg total) by mouth every 6 (six) hours as needed. 08/05/17   Victorino Dike, FNP    Allergies Patient has no known allergies.  Family History  Problem Relation Age of Onset  . Diabetes Mother   . Hypertension Mother   . Hyperlipidemia Mother     Social History Social History   Tobacco Use  . Smoking status: Current Every Day Smoker    Packs/day: 1.00    Types: Cigarettes  . Smokeless tobacco: Never Used  Substance Use Topics  . Alcohol use: No  . Drug use: No    Review of Systems  Constitutional: Negative for fever. Respiratory: Negative for cough or shortness of breath.  Musculoskeletal: Positive for myalgias Skin: Positive for plantar puncture of the left foot Neurological: Negative for numbness or paresthesias. ____________________________________________   PHYSICAL EXAM:  VITAL SIGNS: ED Triage Vitals  Enc Vitals Group     BP 08/05/17 1613 (!) 161/95     Pulse Rate 08/05/17 1613 90     Resp --      Temp 08/05/17 1613 98.7 F (37.1 C)     Temp Source 08/05/17 1613 Oral     SpO2 08/05/17 1613 100 %     Weight 08/05/17 1607 215  lb (97.5 kg)     Height 08/05/17 1607 5\' 9"  (1.753 m)     Head Circumference --      Peak Flow --      Pain Score 08/05/17 1607 9     Pain Loc --      Pain Edu? --      Excl. in [place]? --      Constitutional: Well appearing. Eyes: Conjunctivae are clear without discharge or drainage. Nose: No rhinorrhea noted. Mouth/Throat: Airway is patent.  Neck: No stridor. Unrestricted range of motion observation  Cardiovascular: Capillary refill is <3 seconds.  Respiratory: Respirations are even and unlabored.. Musculoskeletal: Unrestricted range of motion observed. Neurologic: Awake, alert, and oriented x 4.  Skin: Puncture wound to the plantar aspect of the left  foot at the fifth metatarsal.  ____________________________________________   LABS (all labs ordered are listed, but only abnormal results are displayed)  Labs Reviewed - No data to display ____________________________________________  EKG  Not indicated ____________________________________________  RADIOLOGY  Image of the left foot does not demonstrate any retained foreign body. ____________________________________________   PROCEDURES  Procedures ____________________________________________   INITIAL IMPRESSION / ASSESSMENT AND PLAN / ED COURSE  Teresa Manning is a 52 y.o. female who presents to the emergency department for treatment and evaluation after stepping on a board with a nail in it.  She will be given prescriptions for Keflex and Levaquin and was strongly advised to take it as prescribed.  She was advised that she is at high risk for infection if she does not.  She was also given a tetanus booster while here.  For pain, she was given prescriptions for Naprosyn and tramadol.  She was encouraged to rest, ice, and elevate her foot for the next couple of days.  She was advised to return to the emergency department for symptoms of concern if she is unable to schedule appointment with her primary care provider. Medications  naproxen (NAPROSYN) tablet 500 mg (500 mg Oral Given 08/05/17 1727)  HYDROcodone-acetaminophen (NORCO/VICODIN) 5-325 MG per tablet 1 tablet (1 tablet Oral Given 08/05/17 1727)  Tdap (BOOSTRIX) injection 0.5 mL (0.5 mLs Intramuscular Given 08/05/17 1727)     Pertinent labs & imaging results that were available during my care of the patient were reviewed by me and considered in my medical decision making (see chart for details). ____________________________________________   FINAL CLINICAL IMPRESSION(S) / ED DIAGNOSES  Final diagnoses:  Puncture wound of left foot, initial encounter    ED Discharge Orders        Ordered    cephALEXin (KEFLEX)  500 MG capsule  2 times daily     08/05/17 1813    levofloxacin (LEVAQUIN) 500 MG tablet  Daily     08/05/17 1813    naproxen (NAPROSYN) 500 MG tablet  2 times daily with meals     08/05/17 1814    traMADol (ULTRAM) 50 MG tablet  Every 6 hours PRN     08/05/17 1814       Note:  This document was prepared using Dragon voice recognition software and may include unintentional dictation errors.    Victorino Dike, FNP 08/05/17 2320    Carrie Mew, MD 08/11/17 [phone]

## 2018-02-13 ENCOUNTER — Emergency Department: Payer: Self-pay

## 2018-02-13 ENCOUNTER — Emergency Department
Admission: EM | Admit: 2018-02-13 | Discharge: 2018-02-13 | Disposition: A | Discharge status: Home / Self Care | Payer: Self-pay | Attending: Emergency Medicine | Admitting: Emergency Medicine

## 2018-02-13 ENCOUNTER — Encounter: Payer: Self-pay | Admitting: Emergency Medicine

## 2018-02-13 DIAGNOSIS — M25511 Pain in right shoulder: Secondary | ICD-10-CM | POA: Insufficient documentation

## 2018-02-13 DIAGNOSIS — M79621 Pain in right upper arm: Secondary | ICD-10-CM | POA: Insufficient documentation

## 2018-02-13 DIAGNOSIS — M79601 Pain in right arm: Secondary | ICD-10-CM

## 2018-02-13 DIAGNOSIS — Z79899 Other long term (current) drug therapy: Secondary | ICD-10-CM | POA: Insufficient documentation

## 2018-02-13 DIAGNOSIS — F17210 Nicotine dependence, cigarettes, uncomplicated: Secondary | ICD-10-CM | POA: Insufficient documentation

## 2018-02-13 MED ORDER — KETOROLAC TROMETHAMINE 30 MG/ML IJ SOLN
60.0000 mg | Freq: Once | INTRAMUSCULAR | Status: AC
  Administered 2018-02-13: 60 mg via INTRAMUSCULAR
  Filled 2018-02-13: qty 2

## 2018-02-13 MED ORDER — ONDANSETRON 4 MG PO TBDP
ORAL_TABLET | ORAL | Status: AC
  Administered 2018-02-13: 4 mg via ORAL
  Filled 2018-02-13: qty 1

## 2018-02-13 MED ORDER — OXYCODONE-ACETAMINOPHEN 5-325 MG PO TABS: 1.0000 | ORAL_TABLET | ORAL | 0 refills | Status: DC | PRN

## 2018-02-13 MED ORDER — OXYCODONE-ACETAMINOPHEN 5-325 MG PO TABS
ORAL_TABLET | ORAL | Status: AC
  Administered 2018-02-13: 1 via ORAL
  Filled 2018-02-13: qty 1

## 2018-02-13 MED ORDER — IBUPROFEN 800 MG PO TABS: 800.0000 mg | ORAL_TABLET | Freq: Three times a day (TID) | ORAL | 0 refills | Status: DC | PRN

## 2018-02-13 MED ORDER — ONDANSETRON 4 MG PO TBDP
4.0000 mg | ORAL_TABLET | Freq: Once | ORAL | Status: AC
  Administered 2018-02-13: 4 mg via ORAL

## 2018-02-13 MED ORDER — OXYCODONE-ACETAMINOPHEN 5-325 MG PO TABS
1.0000 | ORAL_TABLET | ORAL | Status: DC | PRN
  Administered 2018-02-13: 1 via ORAL

## 2018-02-13 NOTE — ED Notes (Signed)
Patient transported to X-ray 

## 2018-02-13 NOTE — Discharge Instructions (Signed)
1.  You may take pain medicines as needed (Motrin/Percocet #15). 2.  Wear sling as needed for comfort. 3.  Return to the ER for worsening symptoms, increased swelling, difficulty breathing, numbness/tingling, extremity weakness or other concerns.

## 2018-02-13 NOTE — ED Notes (Signed)
Patient given pillows at this time.

## 2018-02-13 NOTE — ED Provider Notes (Signed)
[facility] Emergency Department Provider Note   ____________________________________________   First MD Initiated Contact with Patient 02/13/18 [phone]     (approximate)  I have reviewed the triage vital signs and the nursing notes.   HISTORY  Chief Complaint Arm Pain    HPI Teresa Manning is a 52 y.o. female who presents to the ED from home with a chief complaint of fall with right shoulder and upper arm pain.  Patient tripped outdoors approximately 9 PM and fell onto her right side.  Denies striking head or LOC.  Denies extremity weakness, numbness or tingling.  Denies headache, vision changes, neck pain, chest pain, shortness of breath, abdominal pain, nausea or vomiting.  Denies use of anticoagulants.   Past medical history None  There are no active problems to display for this patient.   Past Surgical History:  Procedure Laterality Date  . COLPOSCOPY      Prior to Admission medications   Medication Sig Start Date End Date Taking? Authorizing Provider  azithromycin (ZITHROMAX Z-PAK) 250 MG tablet 1 pill per day for 4 days (loading dose given in ER) 06/07/17   Fisher, Linden Dolin, PA-C  cyclobenzaprine (FLEXERIL) 5 MG tablet Take 1-2 tablets 3 times daily as needed 04/09/17   Laban Emperor, PA-C  fluconazole (DIFLUCAN) 150 MG tablet Take one now and one in a week 06/07/17   Caryn Section, Linden Dolin, PA-C  ibuprofen (ADVIL,MOTRIN) 800 MG tablet Take 1 tablet (800 mg total) by mouth every 8 (eight) hours as needed for moderate pain. 02/13/18   Paulette Blanch, MD  naproxen (NAPROSYN) 500 MG tablet Take 1 tablet (500 mg total) by mouth 2 (two) times daily with a meal. 08/05/17   Triplett, Cari B, FNP  ondansetron (ZOFRAN) 4 MG tablet Take 1 tablet (4 mg total) by mouth daily as needed for nausea or vomiting. 04/09/17 04/09/18  Laban Emperor, PA-C  oxyCODONE-acetaminophen (PERCOCET/ROXICET) 5-325 MG tablet Take 1 tablet by mouth every 4 (four) hours as needed for severe  pain. 02/13/18   Paulette Blanch, MD  traMADol (ULTRAM) 50 MG tablet Take 1 tablet (50 mg total) by mouth every 6 (six) hours as needed. 08/05/17   Victorino Dike, FNP    Allergies Patient has no known allergies.  Family History  Problem Relation Age of Onset  . Diabetes Mother   . Hypertension Mother   . Hyperlipidemia Mother     Social History Social History   Tobacco Use  . Smoking status: Current Every Day Smoker    Packs/day: 1.00    Types: Cigarettes  . Smokeless tobacco: Never Used  Substance Use Topics  . Alcohol use: No  . Drug use: No    Review of Systems  Constitutional: No fever/chills Eyes: No visual changes. ENT: No sore throat. Cardiovascular: Denies chest pain. Respiratory: Denies shortness of breath. Gastrointestinal: No abdominal pain.  No nausea, no vomiting.  No diarrhea.  No constipation. Genitourinary: Negative for dysuria. Musculoskeletal: Positive for right arm and shoulder pain.  Negative for back pain. Skin: Negative for rash. Neurological: Negative for headaches, focal weakness or numbness.   ____________________________________________   PHYSICAL EXAM:  VITAL SIGNS: ED Triage Vitals  Enc Vitals Group     BP 02/13/18 0130 (!) 159/93     Pulse Rate 02/13/18 0130 90     Resp 02/13/18 0130 (!) 21     Temp 02/13/18 0130 98.6 F (37 C)     Temp Source 02/13/18 0130 Oral  SpO2 02/13/18 0130 98 %     Weight 02/13/18 0126 215 lb (97.5 kg)     Height 02/13/18 0126 5\' 9"  (1.753 m)     Head Circumference --      Peak Flow --      Pain Score 02/13/18 0126 10     Pain Loc --      Pain Edu? --      Excl. in [place]? --     Constitutional: Alert and oriented. Well appearing and in mild acute distress. Eyes: Conjunctivae are normal. PERRL. EOMI. Head: Atraumatic. Nose: Atraumatic. Mouth/Throat: Mucous membranes are moist.  No dental malocclusion. Neck: No stridor.  No cervical spine tenderness to palpation. Cardiovascular: Normal rate,  regular rhythm. Grossly normal heart sounds.  Good peripheral circulation. Respiratory: Normal respiratory effort.  No retractions. Lungs CTAB. Gastrointestinal: Soft and nontender. No distention. No abdominal bruits. No CVA tenderness. Musculoskeletal:  Right shoulder tender to palpation with limited range of motion secondary to pain.  No deformity noted to shoulder or humerus.  Elbow and wrist nontender to palpation with full range of motion.  2+ radial pulses.  Brisk, less than 5-second capillary refill. No lower extremity tenderness nor edema.  No joint effusions. Neurologic:  Normal speech and language. No gross focal neurologic deficits are appreciated. No gait instability. Skin:  Skin is warm, dry and intact. No rash noted. Psychiatric: Mood and affect are normal. Speech and behavior are normal.  ____________________________________________   LABS (all labs ordered are listed, but only abnormal results are displayed)  Labs Reviewed - No data to display ____________________________________________  EKG  None ____________________________________________  RADIOLOGY  ED MD interpretation: Right shoulder and humerus without fracture or deformity  Official radiology report(s): Dg Shoulder Right  Result Date: 02/13/2018 CLINICAL DATA:  Right shoulder and arm pain with limited range of motion after fall. EXAM: RIGHT SHOULDER - 2+ VIEW COMPARISON:  None. FINDINGS: There is no evidence of fracture or dislocation. Mild acromioclavicular degenerative change. Mild inferior glenoid spurring. There is no other focal bone abnormality. Soft tissues are unremarkable. IMPRESSION: Mild degenerative change without acute fracture or dislocation. Electronically Signed   By: Keith Rake M.D.   On: 02/13/2018 02:15   Dg Humerus Right  Result Date: 02/13/2018 CLINICAL DATA:  Right shoulder and arm pain with limited range of motion after fall. EXAM: RIGHT HUMERUS - 2+ VIEW COMPARISON:  None.  FINDINGS: There is no evidence of fracture or other focal bone lesions. Elbow alignment is not well assessed on the provided views. Soft tissues are unremarkable. IMPRESSION: Negative radiographs of the right humerus. Electronically Signed   By: Keith Rake M.D.   On: 02/13/2018 02:16    ____________________________________________   PROCEDURES  Procedure(s) performed: None  Procedures  Critical Care performed: No  ____________________________________________   INITIAL IMPRESSION / ASSESSMENT AND PLAN / ED COURSE  As part of my medical decision making, I reviewed the following data within the [place] History obtained from family, Nursing notes reviewed and incorporated, Old chart reviewed, Radiograph reviewed and Notes from prior ED visits   52 year old female who presents with right shoulder and humerus pain status post mechanical fall.  Received Percocet prior to my examination.  Will administer IM Toradol, placed in shoulder sling and patient will follow-up with orthopedics as needed.  Strict return precautions given.  Patient and spouse verbalize understanding and agree with plan of care.      ____________________________________________   FINAL CLINICAL IMPRESSION(S) / ED  DIAGNOSES  Final diagnoses:  Right arm pain  Acute pain of right shoulder     ED Discharge Orders         Ordered    ibuprofen (ADVIL,MOTRIN) 800 MG tablet  Every 8 hours PRN     02/13/18 0403    oxyCODONE-acetaminophen (PERCOCET/ROXICET) 5-325 MG tablet  Every 4 hours PRN     02/13/18 0403           Note:  This document was prepared using Dragon voice recognition software and may include unintentional dictation errors.    Paulette Blanch, MD 02/13/18 [phone]

## 2018-09-09 ENCOUNTER — Other Ambulatory Visit: Payer: Self-pay | Admitting: Family

## 2018-09-09 DIAGNOSIS — Z1231 Encounter for screening mammogram for malignant neoplasm of breast: Secondary | ICD-10-CM

## 2018-09-12 ENCOUNTER — Other Ambulatory Visit: Payer: Self-pay | Admitting: Internal Medicine

## 2018-09-12 DIAGNOSIS — D259 Leiomyoma of uterus, unspecified: Secondary | ICD-10-CM

## 2018-09-12 DIAGNOSIS — R102 Pelvic and perineal pain: Secondary | ICD-10-CM

## 2018-09-14 ENCOUNTER — Ambulatory Visit (INDEPENDENT_AMBULATORY_CARE_PROVIDER_SITE_OTHER): Payer: Medicaid Other

## 2018-09-14 ENCOUNTER — Other Ambulatory Visit: Payer: Self-pay

## 2018-09-14 ENCOUNTER — Ambulatory Visit (INDEPENDENT_AMBULATORY_CARE_PROVIDER_SITE_OTHER): Payer: Medicaid Other | Admitting: Podiatry

## 2018-09-14 ENCOUNTER — Encounter: Payer: Self-pay | Admitting: Podiatry

## 2018-09-14 VITALS — BP 139/81 | HR 80 | Temp 98.1°F | Resp 16

## 2018-09-14 DIAGNOSIS — M7661 Achilles tendinitis, right leg: Secondary | ICD-10-CM | POA: Diagnosis not present

## 2018-09-14 DIAGNOSIS — M722 Plantar fascial fibromatosis: Secondary | ICD-10-CM

## 2018-09-14 DIAGNOSIS — M7662 Achilles tendinitis, left leg: Secondary | ICD-10-CM | POA: Diagnosis not present

## 2018-09-14 DIAGNOSIS — Q828 Other specified congenital malformations of skin: Secondary | ICD-10-CM

## 2018-09-14 NOTE — Progress Notes (Signed)
  Subjective:  Patient ID: Teresa Manning, female    DOB: Apr 25, 1965,  MRN: [id number] HPI Chief Complaint  Patient presents with  . Foot Pain    Posterior heel bilateral (R>L) - aching, large callused areas x 1 year, tried scraping calluses, always comes back and very tender, callused areas plantar forefoot too-previous foot surgery  . New Patient (Initial Visit)    53 y.o. female presents with the above complaint.   ROS: Denies fever chills nausea vomiting muscle aches pains calf pain back pain chest pain shortness of breath.  No past medical history on file. Past Surgical History:  Procedure Laterality Date  . COLPOSCOPY      Current Outpatient Medications:  .  Cholecalciferol (VITAMIN D3 PO), Take by mouth., Disp: , Rfl:  .  CLONIDINE HCL PO, Take by mouth., Disp: , Rfl:  .  Multiple Vitamin (MULTIVITAMIN) capsule, Take 1 capsule by mouth daily., Disp: , Rfl:  .  NITROFURANTOIN PO, Take by mouth., Disp: , Rfl:   No Known Allergies Review of Systems Objective:   Vitals:   09/14/18 1624  BP: 139/81  Pulse: 80  Resp: 16  Temp: 98.1 F (36.7 C)    General: Well developed, nourished, in no acute distress, alert and oriented x3   Dermatological: Skin is warm, dry and supple bilateral. Nails x 10 are well maintained; remaining integument appears unremarkable at this time. There are no open sores, no preulcerative lesions, no rash or signs of infection present.  Multiple porokeratotic lesions to to the posterior aspect bilateral heels right is worse than the left multiple poor keratomas to the plantar aspect of the forefoot along the lateral lateral aspect of the foot.  No open lesions were noted.  Vascular: Dorsalis Pedis artery and Posterior Tibial artery pedal pulses are 2/4 bilateral with immedate capillary fill time. Pedal hair growth present. No varicosities and no lower extremity edema present bilateral.   Neruologic: Grossly intact via light touch bilateral.  Vibratory intact via tuning fork bilateral. Protective threshold with Semmes Wienstein monofilament intact to all pedal sites bilateral. Patellar and Achilles deep tendon reflexes 2+ bilateral. No Babinski or clonus noted bilateral.   Musculoskeletal: No gross boney pedal deformities bilateral. No pain, crepitus, or limitation noted with foot and ankle range of motion bilateral. Muscular strength 5/5 in all groups tested bilateral.  Severe pes planus is noted bilateral.  Hammertoe deformities noted bilateral.  Multiple surgeries to the forefoot noted.  Gait: Unassisted, Nonantalgic.    Radiographs:  Radiographs taken today demonstrate surgical repair with internal fixation intact she does have soft tissue swelling posterior aspect of the heels but no soft tissue swelling in the Achilles.  These large porokeratotic lesions are visible on the radiograph posteriorly.  Assessment & Plan:   Assessment: Pes planus and porokeratosis.  Plan: Debrided all reactive hyperkeratotic lesions bilaterally discussed the need for orthotics which she declined due to cost.  Follow-up with [place] on an as-needed basis most likely was scheduled with Dr. Sharyon Cable.     Michel Eskelson [place], [place]

## 2018-09-19 ENCOUNTER — Other Ambulatory Visit: Payer: Self-pay

## 2018-09-19 ENCOUNTER — Ambulatory Visit
Admission: RE | Admit: 2018-09-19 | Discharge: 2018-09-19 | Disposition: A | Discharge status: Home / Self Care | Payer: Medicaid Other | Source: Ambulatory Visit | Attending: Internal Medicine | Admitting: Internal Medicine

## 2018-09-19 DIAGNOSIS — D259 Leiomyoma of uterus, unspecified: Secondary | ICD-10-CM | POA: Diagnosis present

## 2018-09-19 DIAGNOSIS — R102 Pelvic and perineal pain: Secondary | ICD-10-CM | POA: Diagnosis present

## 2018-09-26 ENCOUNTER — Encounter: Payer: Medicaid Other | Admitting: Obstetrics & Gynecology

## 2018-09-27 ENCOUNTER — Encounter: Payer: Self-pay | Admitting: Obstetrics & Gynecology

## 2018-09-27 ENCOUNTER — Other Ambulatory Visit: Payer: Self-pay

## 2018-09-27 ENCOUNTER — Telehealth: Payer: Self-pay | Admitting: Obstetrics & Gynecology

## 2018-09-27 ENCOUNTER — Ambulatory Visit (INDEPENDENT_AMBULATORY_CARE_PROVIDER_SITE_OTHER): Payer: Medicaid Other | Admitting: Obstetrics & Gynecology

## 2018-09-27 VITALS — BP 150/90 | Ht 69.0 in | Wt 224.0 lb

## 2018-09-27 DIAGNOSIS — N393 Stress incontinence (female) (male): Secondary | ICD-10-CM

## 2018-09-27 DIAGNOSIS — D219 Benign neoplasm of connective and other soft tissue, unspecified: Secondary | ICD-10-CM

## 2018-09-27 DIAGNOSIS — R102 Pelvic and perineal pain: Secondary | ICD-10-CM | POA: Diagnosis not present

## 2018-09-27 NOTE — Patient Instructions (Addendum)
Uterine Fibroids  Uterine fibroids (leiomyomas) are noncancerous (benign) tumors that can develop in the uterus. Fibroids may also develop in the fallopian tubes, cervix, or tissues (ligaments) near the uterus. You may have one or many fibroids. Fibroids vary in size, weight, and where they grow in the uterus. Some can become quite large. Most fibroids do not require medical treatment. What are the causes? The cause of this condition is not known. What increases the risk? You are more likely to develop this condition if you:  Are in your 30s or 40s and have not gone through menopause.  Have a family history of this condition.  Are of African-American descent.  Had your first period at an early age (early menarche).  Have not had any children (nulliparity).  Are overweight or obese. What are the signs or symptoms? Many women do not have any symptoms. Symptoms of this condition may include:  Heavy menstrual bleeding.  Bleeding or spotting between periods.  Pain and pressure in the pelvic area, between the hips.  Bladder problems, such as needing to urinate urgently or more often than usual.  Inability to have children (infertility).  Failure to carry pregnancy to term (miscarriage). How is this diagnosed? This condition may be diagnosed based on:  Your symptoms and medical history.  A physical exam.  A pelvic exam that includes feeling for any tumors.  Imaging tests, such as ultrasound or MRI. How is this treated? Treatment for this condition may include:  Seeing your health care provider for follow-up visits to monitor your fibroids for any changes.  Taking NSAIDs such as ibuprofen, naproxen, or aspirin to reduce pain.  Hormone medicines. These may be taken as a pill, given in an injection, or delivered by a T-shaped device that is inserted into the uterus (intrauterine device, IUD).  Surgery to remove one of the following: ? The fibroids (myomectomy). Your health  care provider may recommend this if fibroids affect your fertility and you want to become pregnant. ? The uterus (hysterectomy). ? Blood supply to the fibroids (uterine artery embolization). Follow these instructions at home:  Take over-the-counter and prescription medicines only as told by your health care provider.  Ask your health care provider if you should take iron pills or eat more iron-rich foods, such as dark green, leafy vegetables. Heavy menstrual bleeding can cause low iron levels.  If directed, apply heat to your back or abdomen to reduce pain. Use the heat source that your health care provider recommends, such as a moist heat pack or a heating pad. ? Place a towel between your skin and the heat source. ? Leave the heat on for 20-30 minutes. ? Remove the heat if your skin turns bright red. This is especially important if you are unable to feel pain, heat, or cold. You may have a greater risk of getting burned.  Pay close attention to your menstrual cycle. Tell your health care provider about any changes, such as: ? Increased blood flow that requires you to use more pads or tampons than usual. ? A change in the number of days that your period lasts. ? A change in symptoms that are associated with your period, such as back pain or cramps in your abdomen.  Keep all follow-up visits as told by your health care provider. This is important, especially if your fibroids need to be monitored for any changes. Contact a health care provider if you:  Have pelvic pain, back pain, or cramps in your abdomen that   do not get better with medicine or heat.  Develop new bleeding between periods.  Have increased bleeding during or between periods.  Feel unusually tired or weak.  Feel light-headed. Get help right away if you:  Faint.  Have pelvic pain that suddenly gets worse.  Have severe vaginal bleeding that soaks a tampon or pad in 30 minutes or less. Summary  Uterine fibroids are  noncancerous (benign) tumors that can develop in the uterus.  The exact cause of this condition is not known.  Most fibroids do not require medical treatment unless they affect your ability to have children (fertility).  Contact a health care provider if you have pelvic pain, back pain, or cramps in your abdomen that do not get better with medicines.  Make sure you know what symptoms should cause you to get help right away. This information is not intended to replace advice given to you by your health care provider. Make sure you discuss any questions you have with your health care provider. Document Released: 02/28/2000 Document Revised: 02/12/2017 Document Reviewed: 01/26/2017 Elsevier Patient Education  2020 [employer]   Total Laparoscopic Hysterectomy A total laparoscopic hysterectomy is a minimally invasive surgery to remove the uterus and cervix. The fallopian tubes and ovaries can also be removed (bilateral salpingo-oophorectomy) during this surgery, if necessary. This procedure may be done to treat problems such as:  Noncancerous growths in the uterus (uterine fibroids) that cause symptoms.  A condition that causes the lining of the uterus (endometrium) to grow in other areas (endometriosis).  Problems with pelvic support. This is caused by weakened muscles of the pelvis following vaginal childbirth or menopause.  Cancer of the cervix, ovaries, uterus, or endometrium.  Excessive (dysfunctional) uterine bleeding. This surgery is performed by inserting a thin, lighted tube (laparoscope) and surgical instruments into small incisions in the abdomen. The laparoscope sends images to a monitor. The images help the health care provider perform the procedure. After this procedure, you will no longer be able to have a baby, and you will no longer have a menstrual period. Tell a health care provider about:  Any allergies you have.  All medicines you are taking, including vitamins,  herbs, eye drops, creams, and over-the-counter medicines.  Any problems you or family members have had with anesthetic medicines.  Any blood disorders you have.  Any surgeries you have had.  Any medical conditions you have.  Whether you are pregnant or may be pregnant. What are the risks? Generally, this is a safe procedure. However, problems may occur, including:  Infection.  Bleeding.  Blood clots in the legs or lungs.  Allergic reactions to medicines.  Damage to other structures or organs.  The risk that the surgery may have to be switched to the regular one in which a large incision is made in the abdomen (abdominal hysterectomy). What happens before the procedure? Staying hydrated Follow instructions from your health care provider about hydration, which may include:  Up to 2 hours before the procedure - you may continue to drink clear liquids, such as water, clear fruit juice, black coffee, and plain tea Eating and drinking restrictions Follow instructions from your health care provider about eating and drinking, which may include:  8 hours before the procedure - stop eating heavy meals or foods such as meat, fried foods, or fatty foods.  6 hours before the procedure - stop eating light meals or foods, such as toast or cereal.  6 hours before the procedure - stop drinking  milk or drinks that contain milk.  2 hours before the procedure - stop drinking clear liquids. Medicines  Ask your health care provider about: ? Changing or stopping your regular medicines. This is especially important if you are taking diabetes medicines or blood thinners. ? Taking over-the-counter medicines, vitamins, herbs, and supplements. ? Taking medicines such as aspirin and ibuprofen. These medicines can thin your blood. Do not take these medicines unless your health care provider tells you to take them.  You may be given antibiotic medicine to help prevent infection.  You may be asked  to take laxatives.  You may be given medicines to help prevent nausea and vomiting after the procedure. General instructions  Ask your health care provider how your surgical site will be marked or identified.  You may be asked to shower with a germ-killing soap.  Do not use any products that contain nicotine or tobacco, such as cigarettes and e-cigarettes. If you need help quitting, ask your health care provider.  You may have an exam or testing, such as an ultrasound to determine the size and shape of your pelvic organs.  You may have a blood or urine sample taken.  This procedure can affect the way you feel about yourself. Talk with your health care provider about the physical and emotional changes hysterectomy may cause.  Plan to have someone take you home from the hospital or clinic.  Plan to have a responsible adult care for you for at least 24 hours after you leave the hospital or clinic. This is important. What happens during the procedure?  To lower your risk of infection: ? Your health care team will wash or sanitize their hands. ? Your skin will be washed with soap. ? Hair may be removed from the surgical area.  An IV will be inserted into one of your veins.  You will be given one or more of the following: ? A medicine to help you relax (sedative). ? A medicine to make you fall asleep (general anesthetic).  You will be given antibiotic medicine through your IV.  A tube may be inserted down your throat to help you breathe during the procedure.  A gas (carbon dioxide) will be used to inflate your abdomen to allow your surgeon to see inside of your abdomen.  Three or four small incisions will be made in your abdomen.  A laparoscope will be inserted into one of your incisions. Surgical instruments will be inserted through the other incisions in order to perform the procedure.  Your uterus and cervix may be removed through your vagina or cut into small pieces and  removed through the small incisions. Any other organs that need to be removed will also be removed this way.  Carbon dioxide will be released from inside of your abdomen.  Your incisions will be closed with stitches (sutures).  A bandage (dressing) may be placed over your incisions. The procedure may vary among health care providers and hospitals. What happens after the procedure?  Your blood pressure, heart rate, breathing rate, and blood oxygen level will be monitored until the medicines you were given have worn off.  You will be given medicine for pain and nausea as needed.  Do not drive for 24 hours if you received a sedative. Summary  Total Laparoscopic hysterectomy is a procedure to remove your uterus, cervix and sometimes the fallopian tubes and ovaries.  This procedure can affect the way you feel about yourself. Talk with your health care provider  about the physical and emotional changes hysterectomy may cause.  After this procedure, you will no longer be able to have a baby, and you will no longer have a menstrual period.  You will be given pain medicine to control discomfort after this procedure. This information is not intended to replace advice given to you by your health care provider. Make sure you discuss any questions you have with your health care provider. Document Released: 12/28/2006 Document Revised: 02/12/2017 Document Reviewed: 05/13/2016 Elsevier Patient Education  2020 [employer].

## 2018-09-27 NOTE — Telephone Encounter (Signed)
Patient is aware of H&P on 10/24/18 @ 8:40am w/ Dr. Kenton Kingfisher, Pre-admit Testing phone interview and covid testing to be scheduled, and OR on 11/01/18. Patient is aware she will be asked to quarantine after covid testing. Patient is aware she may receive calls from the [place] and [facility]. Patient confirmed Medicaid. Patient to activate MyChart.

## 2018-09-27 NOTE — Progress Notes (Signed)
Consultant: Dr Lamonte Sakai Consultation: Uterine Fibroids  Patient is a 53 yo G56P2 AA F who presents with symptoms related to known uterine fibroids. Periods are absent for 2 years.  Rare menopausal sx.  She has had a 2-3 year h/o lower abdominal and pelvic pressure, feels like carrying 20lbs of weight in pelvis, difficult to stand straight and to do much activity; also has urinary frequency and incontinence especially w activity or with full bladder.   No intermenstrual bleeding, spotting, or discharge.  Recent PAP normal Recent US- 3 fibroids; 4 cm, 4cm, and 2 cm.  PMHx: She  has no past medical history on file. Also,  has a past surgical history that includes Colposcopy., family history includes Diabetes in her mother; Hyperlipidemia in her mother; Hypertension in her mother.,  reports that she has been smoking cigarettes. She has been smoking about 1.00 pack per day. She has never used smokeless tobacco. She reports that she does not drink alcohol or use drugs.  She has a current medication list which includes the following prescription(s): clonidine hcl, multivitamin, cholecalciferol, and nitrofurantoin. Also, has No Known Allergies.  Review of Systems  Constitutional: Negative for chills, fever and malaise/fatigue.  HENT: Negative for congestion, sinus pain and sore throat.   Eyes: Negative for blurred vision and pain.  Respiratory: Negative for cough and wheezing.   Cardiovascular: Negative for chest pain and leg swelling.  Gastrointestinal: Negative for abdominal pain, constipation, diarrhea, heartburn, nausea and vomiting.  Genitourinary: Positive for frequency. Negative for dysuria, hematuria and urgency.  Musculoskeletal: Negative for back pain, joint pain, myalgias and neck pain.  Skin: Negative for itching and rash.  Neurological: Negative for dizziness, tremors and weakness.  Endo/Heme/Allergies: Does not bruise/bleed easily.  Psychiatric/Behavioral: Negative for depression.  The patient is not nervous/anxious and does not have insomnia.     Objective: BP (!) 150/90   Ht 5\' 9"  (1.753 m)   Wt 224 lb (101.6 kg)   BMI 33.08 kg/m  Physical Exam Constitutional:      General: She is not in acute distress.    Appearance: She is well-developed.  Genitourinary:     Pelvic exam was performed with patient supine.     Vulva, inguinal canal, urethra, bladder, vagina and cervix normal.     No vaginal erythema or bleeding.     No cervical polyp or nabothian cyst.     Uterus is enlarged and mobile.     No uterine mass detected.    Uterus is midaxial and globular.     No right or left adnexal mass present.     Right adnexa not tender.     Left adnexa not tender.     Genitourinary Comments: 10 week size uterus  HENT:     Head: Normocephalic and atraumatic.     Nose: Nose normal.  Abdominal:     General: There is no distension.     Palpations: Abdomen is soft.     Tenderness: There is no abdominal tenderness.  Musculoskeletal: Normal range of motion.  Neurological:     Mental Status: She is alert and oriented to person, place, and time.     Cranial Nerves: No cranial nerve deficit.  Skin:    General: Skin is warm and dry.     ASSESSMENT/PLAN:     ICD-10-CM   1. Fibroids  D21.9   2. Pelvic pressure in female  R10.2   3. Stress incontinence of urine  N39.3   Fibroid treatment such  as [place], Lupron, Myomectomy, and Hysterectomy discussed in detail, with the pros and cons of each choice counseled.  No treatment as an option also discussed, as well as control of symptoms alone with hormone therapy. Information provided to the patient.  Pt desires hysterectomy, in fact expressed desire for this immediately in discussion.  TLH BSO discussed, due to fibroids and menopause and having sx's related to fibroids.  Barnett Applebaum, MD, [facility], [facility] 09/27/2018  11:00 AM

## 2018-09-27 NOTE — Telephone Encounter (Signed)
-----   Message from Gae Dry, MD sent at 09/27/2018 11:00 AM EDT ----- Regarding: Surgery Surgery Booking Request Patient Full Name:  Teresa Manning  MRN: [id number]  DOB: April 10, 1965  Surgeon: Hoyt Koch, MD  Requested Surgery Date and Time: August Primary Diagnosis AND Code:     1. Fibroids  D21.9  2. Pelvic pressure in female  R10.2  3. Stress incontinence of urine  N39.3  Secondary Diagnosis and Code:  Surgical Procedure: TLH BSO CYSTOSCOPY L&D Notification: No Admission Status: same day surgery Length of Surgery: 1 hr Special Case Needs: No H&P: yes (date) Phone Interview???: yes Interpreter: Language:  Medical Clearance: no Special Scheduling Instructions: no Acuity: P3  Medicaid consent form

## 2018-10-12 ENCOUNTER — Encounter: Payer: Medicaid Other | Admitting: Obstetrics & Gynecology

## 2018-10-24 ENCOUNTER — Ambulatory Visit (INDEPENDENT_AMBULATORY_CARE_PROVIDER_SITE_OTHER): Payer: Medicaid Other | Admitting: Obstetrics & Gynecology

## 2018-10-24 ENCOUNTER — Encounter: Payer: Self-pay | Admitting: Obstetrics & Gynecology

## 2018-10-24 ENCOUNTER — Other Ambulatory Visit: Payer: Self-pay

## 2018-10-24 VITALS — BP 120/80 | Ht 69.0 in | Wt 223.0 lb

## 2018-10-24 DIAGNOSIS — R102 Pelvic and perineal pain: Secondary | ICD-10-CM | POA: Diagnosis not present

## 2018-10-24 DIAGNOSIS — D219 Benign neoplasm of connective and other soft tissue, unspecified: Secondary | ICD-10-CM | POA: Diagnosis not present

## 2018-10-24 DIAGNOSIS — N393 Stress incontinence (female) (male): Secondary | ICD-10-CM | POA: Diagnosis not present

## 2018-10-24 NOTE — Progress Notes (Signed)
PRE-OPERATIVE HISTORY AND PHYSICAL EXAM  HPI:  Teresa Manning is a 53 y.o. [id number] No LMP recorded. Patient is perimenopausal.; she is being admitted for surgery related to fibroids.   Periods are absent for 2 years.  She has menopausal sx of hot flashes and night sweats.  She has had a 2-3 year h/o lower abdominal and pelvic pressure, feels like carrying 20lbs of weight in pelvis, difficult to stand straight and to do much activity; also has urinary frequency and incontinence especially w activity or with full bladder.   No intermenstrual bleeding, spotting, or discharge. Recent US- 3 fibroids; 4 cm, 4cm, and 2 cm.  PMHx: History reviewed. No pertinent past medical history. Past Surgical History:  Procedure Laterality Date  . COLPOSCOPY     Family History  Problem Relation Age of Onset  . Diabetes Mother   . Hypertension Mother   . Hyperlipidemia Mother    Social History   Tobacco Use  . Smoking status: Current Every Day Smoker    Packs/day: 1.00    Types: Cigarettes  . Smokeless tobacco: Never Used  Substance Use Topics  . Alcohol use: No  . Drug use: No    Current Outpatient Medications:  .  cloNIDine (CATAPRES) 0.3 MG tablet, Take 0.3 mg by mouth daily. , Disp: , Rfl:  .  lisinopril-hydrochlorothiazide (ZESTORETIC) 10-12.5 MG tablet, Take 1 tablet by mouth daily., Disp: , Rfl:  .  PARoxetine (PAXIL) 10 MG tablet, Take 10 mg by mouth daily., Disp: , Rfl:  .  Vitamin D, Ergocalciferol, (DRISDOL) 1.25 MG (50000 UT) CAPS capsule, Take 50,000 Units by mouth every 7 (seven) days., Disp: , Rfl:  Allergies: Patient has no known allergies.  Review of Systems  Constitutional: Negative for chills, fever and malaise/fatigue.  HENT: Negative for congestion, sinus pain and sore throat.   Eyes: Negative for blurred vision and pain.  Respiratory: Negative for cough and wheezing.   Cardiovascular: Negative for chest pain and leg swelling.  Gastrointestinal: Negative for abdominal  pain, constipation, diarrhea, heartburn, nausea and vomiting.  Genitourinary: Negative for dysuria, frequency, hematuria and urgency.  Musculoskeletal: Negative for back pain, joint pain, myalgias and neck pain.  Skin: Negative for itching and rash.  Neurological: Negative for dizziness, tremors and weakness.  Endo/Heme/Allergies: Does not bruise/bleed easily.  Psychiatric/Behavioral: Negative for depression. The patient is not nervous/anxious and does not have insomnia.     Objective: BP 120/80   Ht 5\' 9"  (1.753 m)   Wt 223 lb (101.2 kg)   BMI 32.93 kg/m   Filed Weights   10/24/18 0855  Weight: 223 lb (101.2 kg)   Physical Exam Constitutional:      General: She is not in acute distress.    Appearance: She is well-developed.  HENT:     Head: Normocephalic and atraumatic. No laceration.     Right Ear: Hearing normal.     Left Ear: Hearing normal.     Mouth/Throat:     Pharynx: Uvula midline.  Eyes:     Pupils: Pupils are equal, round, and reactive to light.  Neck:     Musculoskeletal: Normal range of motion and neck supple.     Thyroid: No thyromegaly.  Cardiovascular:     Rate and Rhythm: Normal rate and regular rhythm.     Heart sounds: No murmur. No friction rub. No gallop.   Pulmonary:     Effort: Pulmonary effort is normal. No respiratory distress.  Breath sounds: Normal breath sounds. No wheezing.  Chest:     Breasts:        Right: No mass, skin change or tenderness.        Left: No mass, skin change or tenderness.  Abdominal:     General: Bowel sounds are normal. There is no distension.     Palpations: Abdomen is soft.     Tenderness: There is no abdominal tenderness. There is no rebound.  Musculoskeletal: Normal range of motion.  Neurological:     Mental Status: She is alert and oriented to person, place, and time.     Cranial Nerves: No cranial nerve deficit.  Skin:    General: Skin is warm and dry.  Psychiatric:        Judgment: Judgment normal.   Vitals signs reviewed.    Assessment: 1. Fibroids   2. Pelvic pressure in female   3. Stress incontinence of urine   Plan TLH BSO CYSTO Pros and cons of surgery, ovarian removal vs preservation, and recovery discussed.  I have had a careful discussion with this patient about all the options available and the risk/benefits of each. I have fully informed this patient that surgery may subject her to a variety of discomforts and risks: She understands that most patients have surgery with little difficulty, but problems can happen ranging from minor to fatal. These include nausea, vomiting, pain, bleeding, infection, poor healing, hernia, or formation of adhesions. Unexpected reactions may occur from any drug or anesthetic given. Unintended injury may occur to other pelvic or abdominal structures such as Fallopian tubes, ovaries, bladder, ureter (tube from kidney to bladder), or bowel. Nerves going from the pelvis to the legs may be injured. Any such injury may require immediate or later additional surgery to correct the problem. Excessive blood loss requiring transfusion is very unlikely but possible. Dangerous blood clots may form in the legs or lungs. Physical and sexual activity will be restricted in varying degrees for an indeterminate period of time but most often 2-6 weeks.  Finally, she understands that it is impossible to list every possible undesirable effect and that the condition for which surgery is done is not always cured or significantly improved, and in rare cases may be even worse.Ample time was given to answer all questions.  Barnett Applebaum, MD, [facility], [facility] 10/24/2018  9:13 AM

## 2018-10-24 NOTE — H&P (View-Only) (Signed)
PRE-OPERATIVE HISTORY AND PHYSICAL EXAM  HPI:  Teresa Manning is a 53 y.o. [id number] No LMP recorded. Patient is perimenopausal.; she is being admitted for surgery related to fibroids.   Periods are absent for 2 years.  She has menopausal sx of hot flashes and night sweats.  She has had a 2-3 year h/o lower abdominal and pelvic pressure, feels like carrying 20lbs of weight in pelvis, difficult to stand straight and to do much activity; also has urinary frequency and incontinence especially w activity or with full bladder.   No intermenstrual bleeding, spotting, or discharge. Recent US- 3 fibroids; 4 cm, 4cm, and 2 cm.  PMHx: History reviewed. No pertinent past medical history. Past Surgical History:  Procedure Laterality Date  . COLPOSCOPY     Family History  Problem Relation Age of Onset  . Diabetes Mother   . Hypertension Mother   . Hyperlipidemia Mother    Social History   Tobacco Use  . Smoking status: Current Every Day Smoker    Packs/day: 1.00    Types: Cigarettes  . Smokeless tobacco: Never Used  Substance Use Topics  . Alcohol use: No  . Drug use: No    Current Outpatient Medications:  .  cloNIDine (CATAPRES) 0.3 MG tablet, Take 0.3 mg by mouth daily. , Disp: , Rfl:  .  lisinopril-hydrochlorothiazide (ZESTORETIC) 10-12.5 MG tablet, Take 1 tablet by mouth daily., Disp: , Rfl:  .  PARoxetine (PAXIL) 10 MG tablet, Take 10 mg by mouth daily., Disp: , Rfl:  .  Vitamin D, Ergocalciferol, (DRISDOL) 1.25 MG (50000 UT) CAPS capsule, Take 50,000 Units by mouth every 7 (seven) days., Disp: , Rfl:  Allergies: Patient has no known allergies.  Review of Systems  Constitutional: Negative for chills, fever and malaise/fatigue.  HENT: Negative for congestion, sinus pain and sore throat.   Eyes: Negative for blurred vision and pain.  Respiratory: Negative for cough and wheezing.   Cardiovascular: Negative for chest pain and leg swelling.  Gastrointestinal: Negative for abdominal  pain, constipation, diarrhea, heartburn, nausea and vomiting.  Genitourinary: Negative for dysuria, frequency, hematuria and urgency.  Musculoskeletal: Negative for back pain, joint pain, myalgias and neck pain.  Skin: Negative for itching and rash.  Neurological: Negative for dizziness, tremors and weakness.  Endo/Heme/Allergies: Does not bruise/bleed easily.  Psychiatric/Behavioral: Negative for depression. The patient is not nervous/anxious and does not have insomnia.     Objective: BP 120/80   Ht 5\' 9"  (1.753 m)   Wt 223 lb (101.2 kg)   BMI 32.93 kg/m   Filed Weights   10/24/18 0855  Weight: 223 lb (101.2 kg)   Physical Exam Constitutional:      General: She is not in acute distress.    Appearance: She is well-developed.  HENT:     Head: Normocephalic and atraumatic. No laceration.     Right Ear: Hearing normal.     Left Ear: Hearing normal.     Mouth/Throat:     Pharynx: Uvula midline.  Eyes:     Pupils: Pupils are equal, round, and reactive to light.  Neck:     Musculoskeletal: Normal range of motion and neck supple.     Thyroid: No thyromegaly.  Cardiovascular:     Rate and Rhythm: Normal rate and regular rhythm.     Heart sounds: No murmur. No friction rub. No gallop.   Pulmonary:     Effort: Pulmonary effort is normal. No respiratory distress.  Breath sounds: Normal breath sounds. No wheezing.  Chest:     Breasts:        Right: No mass, skin change or tenderness.        Left: No mass, skin change or tenderness.  Abdominal:     General: Bowel sounds are normal. There is no distension.     Palpations: Abdomen is soft.     Tenderness: There is no abdominal tenderness. There is no rebound.  Musculoskeletal: Normal range of motion.  Neurological:     Mental Status: She is alert and oriented to person, place, and time.     Cranial Nerves: No cranial nerve deficit.  Skin:    General: Skin is warm and dry.  Psychiatric:        Judgment: Judgment normal.   Vitals signs reviewed.    Assessment: 1. Fibroids   2. Pelvic pressure in female   3. Stress incontinence of urine   Plan TLH BSO CYSTO Pros and cons of surgery, ovarian removal vs preservation, and recovery discussed.  I have had a careful discussion with this patient about all the options available and the risk/benefits of each. I have fully informed this patient that surgery may subject her to a variety of discomforts and risks: She understands that most patients have surgery with little difficulty, but problems can happen ranging from minor to fatal. These include nausea, vomiting, pain, bleeding, infection, poor healing, hernia, or formation of adhesions. Unexpected reactions may occur from any drug or anesthetic given. Unintended injury may occur to other pelvic or abdominal structures such as Fallopian tubes, ovaries, bladder, ureter (tube from kidney to bladder), or bowel. Nerves going from the pelvis to the legs may be injured. Any such injury may require immediate or later additional surgery to correct the problem. Excessive blood loss requiring transfusion is very unlikely but possible. Dangerous blood clots may form in the legs or lungs. Physical and sexual activity will be restricted in varying degrees for an indeterminate period of time but most often 2-6 weeks.  Finally, she understands that it is impossible to list every possible undesirable effect and that the condition for which surgery is done is not always cured or significantly improved, and in rare cases may be even worse.Ample time was given to answer all questions.  Barnett Applebaum, MD, [facility], [facility] 10/24/2018  9:13 AM

## 2018-10-24 NOTE — Patient Instructions (Signed)

## 2018-10-25 ENCOUNTER — Encounter
Admission: RE | Admit: 2018-10-25 | Discharge: 2018-10-25 | Disposition: A | Discharge status: Home / Self Care | Payer: Medicaid Other | Source: Ambulatory Visit | Attending: Obstetrics & Gynecology | Admitting: Obstetrics & Gynecology

## 2018-10-25 ENCOUNTER — Other Ambulatory Visit: Payer: Self-pay

## 2018-10-25 DIAGNOSIS — Z01812 Encounter for preprocedural laboratory examination: Secondary | ICD-10-CM | POA: Insufficient documentation

## 2018-10-25 HISTORY — DX: Essential (primary) hypertension: I10

## 2018-10-25 NOTE — Patient Instructions (Signed)
Your procedure is scheduled on: 11/01/18 Report to Day Surgery. [facility]LO[facility] To find out your arrival time please call [phone] between 1PM - 3PM on 10/31/18  Remember: Instructions that are not followed completely may result in serious medical risk,  up to and including death, or upon the discretion of your surgeon and anesthesiologist your  surgery may need to be rescheduled.     _X__ 1. Do not eat food after midnight the night before your procedure.                 No gum chewing or hard candies. You may drink clear liquids up to 2 hours                 before you are scheduled to arrive for your surgery- DO not drink clear                 liquids within 2 hours of the start of your surgery.                 Clear Liquids include:  water, apple juice without pulp, clear carbohydrate                 drink such as Clearfast of Gatorade, Black Coffee or Tea (Do not add                 anything to coffee or tea).  __X__2.  On the morning of surgery brush your teeth with toothpaste and water, you                may rinse your mouth with mouthwash if you wish.  Do not swallow any toothpaste of mouthwash.     _X__ 3.  No Alcohol for 24 hours before or after surgery.   _X__ 4.  Do Not Smoke or use e-cigarettes For 24 Hours Prior to Your Surgery.                 Do not use any chewable tobacco products for at least 6 hours prior to                 surgery.  ____  5.  Bring all medications with you on the day of surgery if instructed.   _X___  6.  Notify your doctor if there is any change in your medical condition      (cold, fever, infections).     Do not wear jewelry, make-up, hairpins, clips or nail polish. Do not wear lotions, powders, or perfumes. You may wear deodorant. Do not shave 48 hours prior to surgery. Men may shave face and neck. Do not bring valuables to the hospital.    [facility] is not responsible for any belongings or  valuables.  Contacts, dentures or bridgework may not be worn into surgery. Leave your suitcase in the car. After surgery it may be brought to your room. For patients admitted to the hospital, discharge time is determined by your treatment team.   Patients discharged the day of surgery will not be allowed to drive home.   Please read over the following fact sheets that you were given:   Surgical Site Infection Prevention / SPIROMETRY         ____ Take these medicines the morning of surgery with A SIP OF WATER:    1. NONE  2.   3.   4.  5.  6.  ____ Fleet Enema (as directed)  __X__ Use CHG Soap as directed  ____ Use inhalers on the day of surgery  ____ Stop metformin 2 days prior to surgery    ____ Take 1/2 of usual insulin dose the night before surgery. No insulin the morning          of surgery.   ____ Stop Coumadin/Plavix/aspirin on   ____ Stop Anti-inflammatories on    ____ Stop supplements until after surgery.    ____ Bring C-Pap to the hospital.    PRACTICE WITH SPIROMETRY AND BRING DAY OF SURGERY

## 2018-10-25 NOTE — Pre-Procedure Instructions (Signed)
EKG/OV NOTE FROM 10/25/18 REQUESTED FROM DR [facility] OFFICE

## 2018-10-28 ENCOUNTER — Other Ambulatory Visit
Admission: RE | Admit: 2018-10-28 | Discharge: 2018-10-28 | Disposition: A | Discharge status: Home / Self Care | Payer: Medicaid Other | Source: Ambulatory Visit | Attending: Obstetrics & Gynecology | Admitting: Obstetrics & Gynecology

## 2018-10-28 ENCOUNTER — Other Ambulatory Visit: Payer: Self-pay

## 2018-10-28 DIAGNOSIS — D219 Benign neoplasm of connective and other soft tissue, unspecified: Secondary | ICD-10-CM | POA: Diagnosis not present

## 2018-10-28 DIAGNOSIS — Z01812 Encounter for preprocedural laboratory examination: Secondary | ICD-10-CM | POA: Insufficient documentation

## 2018-10-28 DIAGNOSIS — R102 Pelvic and perineal pain: Secondary | ICD-10-CM | POA: Diagnosis not present

## 2018-10-28 DIAGNOSIS — N393 Stress incontinence (female) (male): Secondary | ICD-10-CM | POA: Diagnosis not present

## 2018-10-28 DIAGNOSIS — Z20828 Contact with and (suspected) exposure to other viral communicable diseases: Secondary | ICD-10-CM | POA: Diagnosis not present

## 2018-10-28 LAB — BASIC METABOLIC PANEL
Anion gap: 5 (ref 5–15)
BUN: 8 mg/dL (ref 6–20)
CO2: 29 mmol/L (ref 22–32)
Calcium: 9.2 mg/dL (ref 8.9–10.3)
Chloride: 104 mmol/L (ref 98–111)
Creatinine, Ser: 0.73 mg/dL (ref 0.44–1.00)
GFR calc Af Amer: >60 mL/min (ref >60)
GFR calc non Af Amer: >60 mL/min (ref >60)
Glucose, Bld: 114 mg/dL — ABNORMAL HIGH (ref 70–99)
Potassium: 4 mmol/L (ref 3.5–5.1)
Sodium: 138 mmol/L (ref 135–145)

## 2018-10-28 LAB — CBC
HCT: 42.4 % (ref 36.0–46.0)
Hemoglobin: 14.3 g/dL (ref 12.0–15.0)
MCH: 29.4 pg (ref 26.0–34.0)
MCHC: 33.7 g/dL (ref 30.0–36.0)
MCV: 87.1 fL (ref 80.0–100.0)
Platelets: 246 10*3/uL (ref 150–400)
RBC: 4.87 MIL/uL (ref 3.87–5.11)
RDW: 13.2 % (ref 11.5–15.5)
WBC: 4.9 10*3/uL (ref 4.0–10.5)
nRBC: 0 % (ref 0.0–0.2)

## 2018-10-28 LAB — SARS CORONAVIRUS 2 (TAT 6-24 HRS): SARS Coronavirus 2: NEGATIVE

## 2018-10-28 LAB — TYPE AND SCREEN
ABO/RH(D): O POS
Antibody Screen: NEGATIVE

## 2018-10-31 MED ORDER — SODIUM CHLORIDE 0.9 % IV SOLN
2.0000 g | INTRAVENOUS | Status: AC
  Administered 2018-11-01: 2 g via INTRAVENOUS

## 2018-11-01 ENCOUNTER — Ambulatory Visit: Payer: Medicaid Other | Admitting: Anesthesiology

## 2018-11-01 ENCOUNTER — Other Ambulatory Visit: Payer: Self-pay

## 2018-11-01 ENCOUNTER — Encounter
Admission: RE | Disposition: A | Discharge status: Home / Self Care | Payer: Self-pay | Source: Home / Self Care | Attending: Obstetrics & Gynecology

## 2018-11-01 ENCOUNTER — Ambulatory Visit
Admission: RE | Admit: 2018-11-01 | Discharge: 2018-11-01 | Disposition: A | Discharge status: Home / Self Care | Payer: Medicaid Other | Attending: Obstetrics & Gynecology | Admitting: Obstetrics & Gynecology

## 2018-11-01 ENCOUNTER — Encounter: Payer: Self-pay | Admitting: *Deleted

## 2018-11-01 DIAGNOSIS — E669 Obesity, unspecified: Secondary | ICD-10-CM | POA: Diagnosis not present

## 2018-11-01 DIAGNOSIS — D219 Benign neoplasm of connective and other soft tissue, unspecified: Secondary | ICD-10-CM | POA: Diagnosis present

## 2018-11-01 DIAGNOSIS — N393 Stress incontinence (female) (male): Secondary | ICD-10-CM | POA: Diagnosis present

## 2018-11-01 DIAGNOSIS — Z79899 Other long term (current) drug therapy: Secondary | ICD-10-CM | POA: Insufficient documentation

## 2018-11-01 DIAGNOSIS — R102 Pelvic and perineal pain: Secondary | ICD-10-CM | POA: Diagnosis present

## 2018-11-01 DIAGNOSIS — Z6832 Body mass index (BMI) 32.0-32.9, adult: Secondary | ICD-10-CM | POA: Insufficient documentation

## 2018-11-01 DIAGNOSIS — N802 Endometriosis of fallopian tube: Secondary | ICD-10-CM | POA: Insufficient documentation

## 2018-11-01 DIAGNOSIS — I10 Essential (primary) hypertension: Secondary | ICD-10-CM | POA: Insufficient documentation

## 2018-11-01 DIAGNOSIS — D259 Leiomyoma of uterus, unspecified: Secondary | ICD-10-CM | POA: Diagnosis present

## 2018-11-01 DIAGNOSIS — F17210 Nicotine dependence, cigarettes, uncomplicated: Secondary | ICD-10-CM | POA: Insufficient documentation

## 2018-11-01 DIAGNOSIS — D251 Intramural leiomyoma of uterus: Secondary | ICD-10-CM | POA: Diagnosis not present

## 2018-11-01 HISTORY — PX: CYSTOSCOPY: SHX5120

## 2018-11-01 HISTORY — PX: TOTAL LAPAROSCOPIC HYSTERECTOMY WITH BILATERAL SALPINGO OOPHORECTOMY: SHX6845

## 2018-11-01 LAB — ABO/RH: ABO/RH(D): O POS

## 2018-11-01 LAB — POCT PREGNANCY, URINE: Preg Test, Ur: NEGATIVE

## 2018-11-01 SURGERY — HYSTERECTOMY, TOTAL, LAPAROSCOPIC, WITH BILATERAL SALPINGO-OOPHORECTOMY
Anesthesia: General

## 2018-11-01 MED ORDER — OXYCODONE-ACETAMINOPHEN 5-325 MG PO TABS: 1.0000 | ORAL_TABLET | ORAL | 0 refills | Status: DC | PRN

## 2018-11-01 MED ORDER — FAMOTIDINE 20 MG PO TABS
20.0000 mg | ORAL_TABLET | Freq: Once | ORAL | Status: AC
  Administered 2018-11-01: 09:00:00 20 mg via ORAL

## 2018-11-01 MED ORDER — EPHEDRINE SULFATE 50 MG/ML IJ SOLN
INTRAMUSCULAR | Status: DC | PRN
  Administered 2018-11-01: 10 mg via INTRAVENOUS

## 2018-11-01 MED ORDER — OXYCODONE-ACETAMINOPHEN 5-325 MG PO TABS: 1.0000 | ORAL_TABLET | ORAL | Status: DC | PRN

## 2018-11-01 MED ORDER — LACTATED RINGERS IV SOLN
INTRAVENOUS | Status: DC
  Administered 2018-11-01: 09:00:00 via INTRAVENOUS

## 2018-11-01 MED ORDER — DEXAMETHASONE SODIUM PHOSPHATE 10 MG/ML IJ SOLN
INTRAMUSCULAR | Status: AC
  Filled 2018-11-01: qty 1

## 2018-11-01 MED ORDER — LACTATED RINGERS IV SOLN: INTRAVENOUS | Status: DC

## 2018-11-01 MED ORDER — FLUORESCEIN SODIUM 10 % IV SOLN
INTRAVENOUS | Status: AC
  Filled 2018-11-01: qty 5

## 2018-11-01 MED ORDER — ACETAMINOPHEN 650 MG RE SUPP: 650.0000 mg | RECTAL | Status: DC | PRN

## 2018-11-01 MED ORDER — MEPERIDINE HCL 50 MG/ML IJ SOLN: 6.2500 mg | INTRAMUSCULAR | Status: DC | PRN

## 2018-11-01 MED ORDER — MIDAZOLAM HCL 2 MG/2ML IJ SOLN
INTRAMUSCULAR | Status: DC | PRN
  Administered 2018-11-01: 2 mg via INTRAVENOUS

## 2018-11-01 MED ORDER — LIDOCAINE HCL (PF) 2 % IJ SOLN
INTRAMUSCULAR | Status: AC
  Filled 2018-11-01: qty 10

## 2018-11-01 MED ORDER — FAMOTIDINE 20 MG PO TABS
ORAL_TABLET | ORAL | Status: AC
  Administered 2018-11-01: 20 mg via ORAL
  Filled 2018-11-01: qty 1

## 2018-11-01 MED ORDER — FENTANYL CITRATE (PF) 100 MCG/2ML IJ SOLN
25.0000 ug | INTRAMUSCULAR | Status: DC | PRN
  Administered 2018-11-01 (×4): 25 ug via INTRAVENOUS

## 2018-11-01 MED ORDER — MORPHINE SULFATE (PF) 4 MG/ML IV SOLN: 1.0000 mg | INTRAVENOUS | Status: DC | PRN

## 2018-11-01 MED ORDER — OXYCODONE HCL 5 MG PO TABS
5.0000 mg | ORAL_TABLET | Freq: Once | ORAL | Status: AC | PRN
  Administered 2018-11-01: 12:00:00 5 mg via ORAL

## 2018-11-01 MED ORDER — FENTANYL CITRATE (PF) 250 MCG/5ML IJ SOLN
INTRAMUSCULAR | Status: DC | PRN
  Administered 2018-11-01 (×2): 50 ug via INTRAVENOUS
  Administered 2018-11-01: 100 ug via INTRAVENOUS
  Administered 2018-11-01 (×2): 25 ug via INTRAVENOUS

## 2018-11-01 MED ORDER — SUCCINYLCHOLINE CHLORIDE 20 MG/ML IJ SOLN
INTRAMUSCULAR | Status: AC
  Filled 2018-11-01: qty 1

## 2018-11-01 MED ORDER — LACTATED RINGERS IV SOLN
INTRAVENOUS | Status: DC
  Administered 2018-11-01 (×2): via INTRAVENOUS

## 2018-11-01 MED ORDER — PHENYLEPHRINE HCL (PRESSORS) 10 MG/ML IV SOLN
INTRAVENOUS | Status: DC | PRN
  Administered 2018-11-01: 100 ug via INTRAVENOUS

## 2018-11-01 MED ORDER — KETOROLAC TROMETHAMINE 30 MG/ML IJ SOLN
30.0000 mg | Freq: Four times a day (QID) | INTRAMUSCULAR | Status: DC
  Administered 2018-11-01: 12:00:00 30 mg via INTRAVENOUS

## 2018-11-01 MED ORDER — OXYCODONE HCL 5 MG/5ML PO SOLN: 5.0000 mg | Freq: Once | ORAL | Status: AC | PRN

## 2018-11-01 MED ORDER — OXYCODONE HCL 5 MG PO TABS
ORAL_TABLET | ORAL | Status: AC
  Filled 2018-11-01: qty 1

## 2018-11-01 MED ORDER — MIDAZOLAM HCL 2 MG/2ML IJ SOLN
INTRAMUSCULAR | Status: AC
  Filled 2018-11-01: qty 2

## 2018-11-01 MED ORDER — SUGAMMADEX SODIUM 200 MG/2ML IV SOLN
INTRAVENOUS | Status: DC | PRN
  Administered 2018-11-01: 200 mg via INTRAVENOUS

## 2018-11-01 MED ORDER — KETOROLAC TROMETHAMINE 30 MG/ML IJ SOLN
INTRAMUSCULAR | Status: AC
  Filled 2018-11-01: qty 1

## 2018-11-01 MED ORDER — SODIUM CHLORIDE 0.9 % IV SOLN
INTRAVENOUS | Status: AC
  Filled 2018-11-01: qty 2

## 2018-11-01 MED ORDER — FENTANYL CITRATE (PF) 100 MCG/2ML IJ SOLN
INTRAMUSCULAR | Status: AC
  Administered 2018-11-01: 11:00:00 25 ug via INTRAVENOUS
  Filled 2018-11-01: qty 2

## 2018-11-01 MED ORDER — BUPIVACAINE HCL (PF) 0.5 % IJ SOLN
INTRAMUSCULAR | Status: DC | PRN
  Administered 2018-11-01: 13 mL

## 2018-11-01 MED ORDER — ONDANSETRON HCL 4 MG/2ML IJ SOLN
INTRAMUSCULAR | Status: AC
  Filled 2018-11-01: qty 2

## 2018-11-01 MED ORDER — FENTANYL CITRATE (PF) 250 MCG/5ML IJ SOLN
INTRAMUSCULAR | Status: AC
  Filled 2018-11-01: qty 5

## 2018-11-01 MED ORDER — PROMETHAZINE HCL 25 MG/ML IJ SOLN: 6.2500 mg | INTRAMUSCULAR | Status: DC | PRN

## 2018-11-01 MED ORDER — SUGAMMADEX SODIUM 200 MG/2ML IV SOLN
INTRAVENOUS | Status: AC
  Filled 2018-11-01: qty 2

## 2018-11-01 MED ORDER — PROPOFOL 10 MG/ML IV BOLUS
INTRAVENOUS | Status: DC | PRN
  Administered 2018-11-01: 50 mg via INTRAVENOUS
  Administered 2018-11-01: 150 mg via INTRAVENOUS

## 2018-11-01 MED ORDER — PROPOFOL 10 MG/ML IV BOLUS
INTRAVENOUS | Status: AC
  Filled 2018-11-01: qty 20

## 2018-11-01 MED ORDER — ACETAMINOPHEN 325 MG PO TABS: 650.0000 mg | ORAL_TABLET | ORAL | Status: DC | PRN

## 2018-11-01 MED ORDER — ROCURONIUM BROMIDE 100 MG/10ML IV SOLN
INTRAVENOUS | Status: DC | PRN
  Administered 2018-11-01 (×3): 10 mg via INTRAVENOUS
  Administered 2018-11-01: 20 mg via INTRAVENOUS

## 2018-11-01 MED ORDER — DEXAMETHASONE SODIUM PHOSPHATE 10 MG/ML IJ SOLN
INTRAMUSCULAR | Status: DC | PRN
  Administered 2018-11-01: 10 mg via INTRAVENOUS

## 2018-11-01 MED ORDER — ONDANSETRON HCL 4 MG/2ML IJ SOLN
INTRAMUSCULAR | Status: DC | PRN
  Administered 2018-11-01: 4 mg via INTRAVENOUS

## 2018-11-01 MED ORDER — SUCCINYLCHOLINE CHLORIDE 20 MG/ML IJ SOLN
INTRAMUSCULAR | Status: DC | PRN
  Administered 2018-11-01: 100 mg via INTRAVENOUS

## 2018-11-01 MED ORDER — LIDOCAINE HCL (CARDIAC) PF 100 MG/5ML IV SOSY
PREFILLED_SYRINGE | INTRAVENOUS | Status: DC | PRN
  Administered 2018-11-01: 80 mg via INTRAVENOUS

## 2018-11-01 MED ORDER — ROCURONIUM BROMIDE 50 MG/5ML IV SOLN
INTRAVENOUS | Status: AC
  Filled 2018-11-01: qty 1

## 2018-11-01 MED ORDER — EPHEDRINE SULFATE 50 MG/ML IJ SOLN
INTRAMUSCULAR | Status: AC
  Filled 2018-11-01: qty 1

## 2018-11-01 SURGICAL SUPPLY — 51 items
BAG URINE DRAINAGE (UROLOGICAL SUPPLIES) ×2 IMPLANT
BLADE SURG SZ11 CARB STEEL (BLADE) ×2 IMPLANT
CANISTER SUCT 1200ML W/VALVE (MISCELLANEOUS) ×2 IMPLANT
CATH FOLEY 2WAY  5CC 16FR (CATHETERS) ×1
CATH URTH 16FR FL 2W BLN LF (CATHETERS) ×1 IMPLANT
CHLORAPREP W/TINT 26 (MISCELLANEOUS) ×2 IMPLANT
COVER WAND RF STERILE (DRAPES) ×2 IMPLANT
DEFOGGER SCOPE WARMER CLEARIFY (MISCELLANEOUS) ×2 IMPLANT
DERMABOND ADVANCED (GAUZE/BANDAGES/DRESSINGS) ×1
DERMABOND ADVANCED .7 DNX12 (GAUZE/BANDAGES/DRESSINGS) ×1 IMPLANT
DEVICE SUTURE ENDOST 10MM (ENDOMECHANICALS) ×2 IMPLANT
DRAPE CAMERA CLOSED 9X96 (DRAPES) ×2 IMPLANT
DRSG TEGADERM 2-3/8X2-3/4 SM (GAUZE/BANDAGES/DRESSINGS) IMPLANT
GLOVE BIO SURGEON STRL SZ8 (GLOVE) ×10 IMPLANT
GLOVE INDICATOR 8.0 STRL GRN (GLOVE) ×2 IMPLANT
GOWN STRL REUS W/ TWL LRG LVL3 (GOWN DISPOSABLE) ×1 IMPLANT
GOWN STRL REUS W/ TWL XL LVL3 (GOWN DISPOSABLE) ×2 IMPLANT
GOWN STRL REUS W/TWL LRG LVL3 (GOWN DISPOSABLE) ×1
GOWN STRL REUS W/TWL XL LVL3 (GOWN DISPOSABLE) ×2
GRASPER SUT TROCAR 14GX15 (MISCELLANEOUS) ×2 IMPLANT
IRRIGATION STRYKERFLOW (MISCELLANEOUS) ×1 IMPLANT
IRRIGATOR STRYKERFLOW (MISCELLANEOUS) ×2
IV LACTATED RINGERS 1000ML (IV SOLUTION) ×4 IMPLANT
KIT PINK PAD W/HEAD ARE REST (MISCELLANEOUS) ×2
KIT PINK PAD W/HEAD ARM REST (MISCELLANEOUS) ×1 IMPLANT
KIT TURNOVER CYSTO (KITS) ×2 IMPLANT
LABEL OR SOLS (LABEL) ×2 IMPLANT
MANIPULATOR VCARE LG CRV RETR (MISCELLANEOUS) IMPLANT
MANIPULATOR VCARE SML CRV RETR (MISCELLANEOUS) IMPLANT
MANIPULATOR VCARE STD CRV RETR (MISCELLANEOUS) IMPLANT
NEEDLE VERESS 14GA 120MM (NEEDLE) ×2 IMPLANT
NS IRRIG 500ML POUR BTL (IV SOLUTION) ×2 IMPLANT
OCCLUDER COLPOPNEUMO (BALLOONS) ×2 IMPLANT
PACK GYN LAPAROSCOPIC (MISCELLANEOUS) ×2 IMPLANT
PAD OB MATERNITY 4.3X12.25 (PERSONAL CARE ITEMS) ×2 IMPLANT
PAD PREP 24X41 OB/GYN DISP (PERSONAL CARE ITEMS) ×2 IMPLANT
PORT ACCESS TROCAR AIRSEAL 12 (TROCAR) ×1 IMPLANT
PORT ACCESS TROCAR AIRSEAL 5M (TROCAR) ×1
SCISSORS METZENBAUM CVD 33 (INSTRUMENTS) ×2 IMPLANT
SET CYSTO W/LG BORE CLAMP LF (SET/KITS/TRAYS/PACK) ×2 IMPLANT
SET TRI-LUMEN FLTR TB AIRSEAL (TUBING) ×2 IMPLANT
SHEARS HARMONIC ACE PLUS 36CM (ENDOMECHANICALS) ×2 IMPLANT
SLEEVE ENDOPATH XCEL 5M (ENDOMECHANICALS) ×2 IMPLANT
SPONGE GAUZE 2X2 8PLY STRL LF (GAUZE/BANDAGES/DRESSINGS) IMPLANT
SURGILUBE 2OZ TUBE FLIPTOP (MISCELLANEOUS) ×2 IMPLANT
SUT ENDO VLOC 180-0-8IN (SUTURE) ×2 IMPLANT
SUT VIC AB 0 CT1 36 (SUTURE) ×2 IMPLANT
SUT VIC AB 4-0 FS2 27 (SUTURE) ×2 IMPLANT
SYR 10ML LL (SYRINGE) ×2 IMPLANT
SYR 50ML LL SCALE MARK (SYRINGE) ×2 IMPLANT
TROCAR XCEL NON-BLD 5MMX100MML (ENDOMECHANICALS) ×2 IMPLANT

## 2018-11-01 NOTE — Anesthesia Post-op Follow-up Note (Signed)
Anesthesia QCDR form completed.        

## 2018-11-01 NOTE — Op Note (Signed)
Operative Report:  PRE-OP DIAGNOSIS: 75916B84.6 FIBROIDS R10.2 PELVIC PRESSURE IN FEMALE N39.3 STRESS INCONTINENCE OF URINE   POST-OP DIAGNOSIS: 65993T70.1 FIBROIDS R10.2 PELVIC PRESSURE IN FEMALE N39.3 STRESS INCONTINENCE OF URINE   PROCEDURE: Procedure(s): TOTAL LAPAROSCOPIC HYSTERECTOMY WITH BILATERAL SALPINGO OOPHORECTOMY CYSTOSCOPY  SURGEON: Barnett Applebaum, MD, FACOG  ASSISTANT: Dr Georgianne Fick, No other capable assistant available, in surgery requiring high level assistant.  ANESTHESIA: General endotracheal anesthesia  ESTIMATED BLOOD LOSS: less than 50   SPECIMENS: Uterus, Tubes, Ovaries.  COMPLICATIONS: None  DISPOSITION: stable to PACU  FINDINGS: Intraabdominal adhesions were not noted. Fibroids, multiple on uterus, 12 weeks size.  PROCEDURE:  The patient was taken to the OR where anesthesia was administed. She was prepped and draped in the normal sterile fashion in the dorsal lithotomy position in the [place] stirrups. A time out was performed. A Graves speculum was inserted, the cervix was grasped with a single tooth tenaculum and the endometrial cavity was sounded. The cervix was progressively dilated to a size 18 [place] with [employer] dilators. A V-Care uterine manipulator was inserted in the usual fashion without incident. Gloves were changed and attention was turned to the abdomen.   An infraumbilical transverse 34mm skin incision was made with the scalpel after local anesthesia applied to the skin. A Veress-step needle was inserted in the usual fashion and confirmed using the hanging drop technique. A pneumoperitoneum was obtained by insufflation of CO2 (opening pressure of 31mmHg) to 40mmHg. A diagnostic laparoscopy was performed yielding the previously described findings. Attention was turned to the left lower quadrant where after visualization of the inferior epigastric vessels a 26mm skin incision was made with the scalpel. A 5 mm laparoscopic port was inserted. The same  procedure was repeated in the right lower quadrant with a 68mm trocar. Attention was turned to the left aspect of the uterus, where after visualization of the ureter, the round ligament was coagulated and transected using the 6mm Harmonic Scapel. The anterior and posterior leafs of the broad ligament were dissected off as the anterior one was coagulated and transected in a caudal direction towards the cuff of the uterine manipulator.  Attention was then turned to the left fallopian tube and ovary which was recognized by visualization of the fimbria. The infundibulopelvic ligament and its blood vessels were carefully coagulated and transected using the Harmonic scapel.  Attention was turned to the right aspect of the uterus where the same procedure was performed.  The vesicouterine reflection of the peritoneum was dissected with the harmonic scapel and the bladder flap was created bluntly.  The uterine vessels were coagulated and transected bilaterally using first bipolar cautery and then the harmonic scapel. A 360 degree, circumferential colpotomy was done to completely amputate the uterus with cervix and tubes. Once the specimen was amputated it was delivered through the vagina.   The colpotomy was repaired in a simple running fashion using a delayed absorbable suture with an endo-stitch device.  Vaginal exam confirms complete closure.  The cavity was copiously irrigated. A survey of the pelvic cavity revealed adequate hemostasis and no injury to bowel, bladder, or ureter.  The fascia at the RLQ incision site is closed with a vicryl suture utilizing a fascia closure device.   A diagnostic cystoscopy was performed using saline distension of bladder with no lesions or injuries noted.  Bilateral urine flow from each ureteral orifice is visualized.  At this point the procedure was finalized. All the instruments were removed from the patient's body. Gas was expelled and  patient is leveled.  Incisions are closed  with skin adhesive.    Patient goes to recovery room in stable condition.  All sponge, instrument, and needle counts are correct x2.     Barnett Applebaum, MD, [facility], [facility] 11/01/2018  10:50 AM

## 2018-11-01 NOTE — Anesthesia Preprocedure Evaluation (Signed)
Anesthesia Evaluation  Patient identified by MRN, date of birth, ID band Patient awake    Reviewed: Allergy & Precautions, NPO status , Patient's Chart, lab work & pertinent test results  History of Anesthesia Complications Negative for: history of anesthetic complications  Airway Mallampati: II  TM Distance: >3 FB Neck ROM: Full    Dental  (+) Edentulous Upper, Edentulous Lower   Pulmonary neg sleep apnea, neg COPD, Current SmokerPatient did not abstain from smoking.,    breath sounds clear to auscultation- rhonchi (-) wheezing      Cardiovascular hypertension, Pt. on medications (-) CAD, (-) Past MI, (-) Cardiac Stents and (-) CABG  Rhythm:Regular Rate:Normal - Systolic murmurs and - Diastolic murmurs    Neuro/Psych neg Seizures negative neurological ROS  negative psych ROS   GI/Hepatic negative GI ROS, Neg liver ROS,   Endo/Other  negative endocrine ROSneg diabetes  Renal/GU negative Renal ROS     Musculoskeletal negative musculoskeletal ROS (+)   Abdominal (+) + obese,   Peds  Hematology negative hematology ROS (+)   Anesthesia Other Findings Past Medical History: No date: Hypertension   Reproductive/Obstetrics                             Anesthesia Physical Anesthesia Plan  ASA: II  Anesthesia Plan: General   Post-op Pain Management:    Induction: Intravenous  PONV Risk Score and Plan: 1 and Ondansetron, Dexamethasone and Midazolam  Airway Management Planned: Oral ETT  Additional Equipment:   Intra-op Plan:   Post-operative Plan: Extubation in OR  Informed Consent: I have reviewed the patients History and Physical, chart, labs and discussed the procedure including the risks, benefits and alternatives for the proposed anesthesia with the patient or authorized representative who has indicated his/her understanding and acceptance.     Dental advisory given  Plan  Discussed with: CRNA and Anesthesiologist  Anesthesia Plan Comments:         Anesthesia Quick Evaluation

## 2018-11-01 NOTE — Anesthesia Procedure Notes (Signed)
Procedure Name: Intubation Date/Time: 11/01/2018 9:04 AM Performed by: Jerrye Noble, CRNA Pre-anesthesia Checklist: Patient identified Patient Re-evaluated:Patient Re-evaluated prior to induction Oxygen Delivery Method: Circle system utilized Preoxygenation: Pre-oxygenation with 100% oxygen Induction Type: IV induction Ventilation: Mask ventilation without difficulty Laryngoscope Size: Mac and 3 Grade View: Grade I Tube type: Oral Tube size: 7.0 mm Number of attempts: 1 Airway Equipment and Method: Stylet Placement Confirmation: ETT inserted through vocal cords under direct vision,  positive ETCO2 and breath sounds checked- equal and bilateral

## 2018-11-01 NOTE — Anesthesia Post-op Follow-up Note (Deleted)
Anesthesia QCDR form completed.        

## 2018-11-01 NOTE — Discharge Instructions (Addendum)
Total Laparoscopic Hysterectomy, Care After °This sheet gives you information about how to care for yourself after your procedure. Your health care provider may also give you more specific instructions. If you have problems or questions, contact your health care provider. °What can I expect after the procedure? °After the procedure, it is common to have: °· Pain and bruising around your incisions. °· A sore throat, if a breathing tube was used during surgery. °· Fatigue. °· Poor appetite. °· Less interest in sex. °If your ovaries were also removed, it is also common to have symptoms of menopause such as hot flashes, night sweats, and lack of sleep (insomnia). °Follow these instructions at home: °Bathing °· Do not take baths, swim, or use a hot tub until your health care provider approves. You may need to only take showers for 2-3 weeks. °· Keep your bandage (dressing) dry until your health care provider says it can be removed. °Incision care ° °· Follow instructions from your health care provider about how to take care of your incisions. Make sure you: °? Wash your hands with soap and water before you change your dressing. If soap and water are not available, use hand sanitizer. °? Change your dressing as told by your health care provider. °? Leave stitches (sutures), skin glue, or adhesive strips in place. These skin closures may need to stay in place for 2 weeks or longer. If adhesive strip edges start to loosen and curl up, you may trim the loose edges. Do not remove adhesive strips completely unless your health care provider tells you to do that. °· Check your incision area every day for signs of infection. Check for: °? Redness, swelling, or pain. °? Fluid or blood. °? Warmth. °? Pus or a bad smell. °Activity °· Get plenty of rest and sleep. °· Do not lift anything that is heavier than 10 lbs (4.5 kg) for one month after surgery, or as long as told by your health care provider. °· Do not drive or use heavy  machinery while taking prescription pain medicine. °· Do not drive for 24 hours if you were given a medicine to help you relax (sedative). °· Return to your normal activities as told by your health care provider. Ask your health care provider what activities are safe for you. °Lifestyle ° °· Do not use any products that contain nicotine or tobacco, such as cigarettes and e-cigarettes. These can delay healing. If you need help quitting, ask your health care provider. °· Do not drink alcohol until your health care provider approves. °General instructions °· Do not douche, use tampons, or have sex for at least 6 weeks, or as told by your health care provider. °· Take over-the-counter and prescription medicines only as told by your health care provider. °· To monitor yourself for a fever, take your temperature at least once a day during recovery. °· If you struggle with physical or emotional changes after your procedure, speak with your health care provider or a therapist. °· To prevent or treat constipation while you are taking prescription pain medicine, your health care provider may recommend that you: °? Drink enough fluid to keep your urine clear or pale yellow. °? Take over-the-counter or prescription medicines. °? Eat foods that are high in fiber, such as fresh fruits and vegetables, whole grains, and beans. °? Limit foods that are high in fat and processed sugars, such as fried and sweet foods. °· Keep all follow-up visits as told by your health care provider.   This is important. Contact a health care provider if:  You have chills or a fever.  You have redness, swelling, or pain around an incision.  You have fluid or blood coming from an incision.  Your incision feels warm to the touch.  You have pus or a bad smell coming from an incision.  An incision breaks open.  You feel dizzy or light-headed.  You have pain or bleeding when you urinate.  You have diarrhea, nausea, or vomiting that does not  go away.  You have abnormal vaginal discharge.  You have a rash.  You have pain that does not get better with medicine. Get help right away if:  You have a fever and your symptoms suddenly get worse.  You have severe abdominal pain.  You have chest pain.  You have shortness of breath.  You faint.  You have pain, swelling, or redness on your leg.  You have heavy vaginal bleeding with blood clots. Summary  After the procedure it is common to have abdominal pain. Your provider will give you medication for this.  Do not take baths, swim, or use a hot tub until your health care provider approves.  Do not lift anything that is heavier than 10 lbs (4.5 kg) for one month after surgery, or as long as told by your health care provider.  Notify your provider if you have any signs or symptoms of infection after the procedure. This information is not intended to replace advice given to you by your health care provider. Make sure you discuss any questions you have with your health care provider. Document Released: 12/21/2012 Document Revised: 02/12/2017 Document Reviewed: 05/13/2016 Elsevier Patient Education  2020 [place]   1) The drugs that you were given will stay in your system until tomorrow so for the next 24 hours you should not:  A) Drive an automobile B) Make any legal decisions C) Drink any alcoholic beverage   2) You may resume regular meals tomorrow.  Today it is better to start with liquids and gradually work up to solid foods.  You may eat anything you prefer, but it is better to start with liquids, then soup and crackers, and gradually work up to solid foods.   3) Please notify your doctor immediately if you have any unusual bleeding, trouble breathing, redness and pain at the surgery site, drainage, fever, or pain not relieved by medication.    4) Additional Instructions:        Please contact your  physician with any problems or Same Day Surgery at [phone], Monday through Friday 6 am to 4 pm, or [place] [facility] number at [phone].

## 2018-11-01 NOTE — Transfer of Care (Signed)
Immediate Anesthesia Transfer of Care Note  Patient: Teresa Manning  Procedure(s) Performed: TOTAL LAPAROSCOPIC HYSTERECTOMY WITH BILATERAL SALPINGO OOPHORECTOMY (N/A ) CYSTOSCOPY (N/A )  Patient Location: PACU  Anesthesia Type:General  Level of Consciousness: drowsy and patient cooperative  Airway & Oxygen Therapy: Patient Spontanous Breathing  Post-op Assessment: Report given to RN and Post -op Vital signs reviewed and stable  Post vital signs: stable  Last Vitals:  Vitals Value Taken Time  BP 131/76 11/01/18 1103  Temp 35.8 C 11/01/18 1103  Pulse 76 11/01/18 1105  Resp 13 11/01/18 1105  SpO2 85 % 11/01/18 1105  Vitals shown include unvalidated device data.  Last Pain:  Vitals:   11/01/18 1103  TempSrc: Skin  PainSc:          Complications: No apparent anesthesia complications

## 2018-11-01 NOTE — Interval H&P Note (Signed)
History and Physical Interval Note:  11/01/2018 8:38 AM  Teresa Manning  has presented today for surgery, with the diagnosis of 58571D21.9 FIBROIDS R10.2 PELVIC PRESSURE IN FEMALE N39.3 STRESS INCONTINENCE OF URINE.  The various methods of treatment have been discussed with the patient and family. After consideration of risks, benefits and other options for treatment, the patient has consented to  Procedure(s): TOTAL LAPAROSCOPIC HYSTERECTOMY WITH BILATERAL SALPINGO OOPHORECTOMY (N/A) CYSTOSCOPY (N/A) as a surgical intervention.  The patient's history has been reviewed, patient examined, no change in status, stable for surgery.  I have reviewed the patient's chart and labs.  Questions were answered to the patient's satisfaction.     Hoyt Koch

## 2018-11-02 NOTE — Anesthesia Postprocedure Evaluation (Signed)
Anesthesia Post Note  Patient: Teresa Manning  Procedure(s) Performed: TOTAL LAPAROSCOPIC HYSTERECTOMY WITH BILATERAL SALPINGO OOPHORECTOMY (N/A ) CYSTOSCOPY (N/A )  Patient location during evaluation: PACU Anesthesia Type: General Level of consciousness: awake and alert Pain management: pain level controlled Vital Signs Assessment: post-procedure vital signs reviewed and stable Respiratory status: spontaneous breathing, nonlabored ventilation and respiratory function stable Cardiovascular status: blood pressure returned to baseline and stable Postop Assessment: no apparent nausea or vomiting Anesthetic complications: no     Last Vitals:  Vitals:   11/01/18 1349 11/01/18 1415  BP: (!) 154/83 (!) 157/73  Pulse: 61 62  Resp: 16 18  Temp:    SpO2: 100% 100%    Last Pain:  Vitals:   11/02/18 0916  TempSrc:   PainSc: [place]

## 2018-11-03 LAB — SURGICAL PATHOLOGY

## 2018-11-04 ENCOUNTER — Other Ambulatory Visit: Payer: Self-pay

## 2018-11-04 ENCOUNTER — Telehealth: Payer: Self-pay

## 2018-11-04 ENCOUNTER — Ambulatory Visit (INDEPENDENT_AMBULATORY_CARE_PROVIDER_SITE_OTHER): Payer: Medicaid Other | Admitting: Obstetrics & Gynecology

## 2018-11-04 ENCOUNTER — Telehealth: Payer: Self-pay | Admitting: Obstetrics & Gynecology

## 2018-11-04 ENCOUNTER — Encounter: Payer: Self-pay | Admitting: Obstetrics & Gynecology

## 2018-11-04 VITALS — BP 120/80 | Ht 69.0 in | Wt 224.0 lb

## 2018-11-04 DIAGNOSIS — R300 Dysuria: Secondary | ICD-10-CM | POA: Diagnosis not present

## 2018-11-04 DIAGNOSIS — N3000 Acute cystitis without hematuria: Secondary | ICD-10-CM | POA: Diagnosis not present

## 2018-11-04 LAB — POCT URINALYSIS DIPSTICK OB
Bilirubin, UA: NEGATIVE
Blood, UA: POSITIVE
Glucose, UA: NEGATIVE
Ketones, UA: NEGATIVE
Nitrite, UA: NEGATIVE
Spec Grav, UA: 1.01 (ref 1.010–1.025)
Urobilinogen, UA: 0.2 E.U./dL
pH, UA: 5 (ref 5.0–8.0)

## 2018-11-04 MED ORDER — SULFAMETHOXAZOLE-TRIMETHOPRIM 800-160 MG PO TABS: 1.0000 | ORAL_TABLET | Freq: Two times a day (BID) | ORAL | 0 refills | Status: AC

## 2018-11-04 MED ORDER — FLAVOXATE HCL 100 MG PO TABS: 100.0000 mg | ORAL_TABLET | Freq: Three times a day (TID) | ORAL | 1 refills | Status: DC | PRN

## 2018-11-04 NOTE — Telephone Encounter (Signed)
Patient is schedule for 11/04/18 at 2 with [facility]

## 2018-11-04 NOTE — Progress Notes (Signed)
  HPI:      [name] is a 53 y.o. [phone] who LMP was No LMP recorded. Patient is perimenopausal., presents today for a problem visit.    Urinary Tract Infection: Patient complains of burning with urination . She has had symptoms for 1 day. Patient also complains of suprapubic pain. Patient denies fever. Patient does not have a history of recurrent UTI.  Patient does not have a history of pyelonephritis.   PMHx: She  has a past medical history of Hypertension. Also,  has a past surgical history that includes Colposcopy; Total laparoscopic hysterectomy with bilateral salpingo oophorectomy (N/A, 11/01/2018); and Cystoscopy (N/A, 11/01/2018)., family history includes Diabetes in her mother; Hyperlipidemia in her mother; Hypertension in her mother.,  reports that she has been smoking cigarettes. She has been smoking about 1.00 pack per day. She has never used smokeless tobacco. She reports that she does not drink alcohol or use drugs.  She has a current medication list which includes the following prescription(s): clonidine, lisinopril-hydrochlorothiazide, oxycodone-acetaminophen, paroxetine, vitamin d (ergocalciferol), flavoxate, and sulfamethoxazole-trimethoprim. Also, has No Known Allergies.  Review of Systems  All other systems reviewed and are negative.   Objective: BP 120/80   Ht 5\' 9"  (1.753 m)   Wt 224 lb (101.6 kg)   BMI 33.08 kg/m  Physical Exam Constitutional:      General: She is not in acute distress.    Appearance: She is well-developed.  Musculoskeletal: Normal range of motion.  Neurological:     Mental Status: She is alert and oriented to person, place, and time.  Skin:    General: Skin is warm and dry.  Vitals signs reviewed.   Back: No CVAT  Results for orders placed or performed in visit on 11/04/18  POC Urinalysis Dipstick OB  Result Value Ref Range   Color, UA     Clarity, UA     Glucose, UA Negative Negative   Bilirubin, UA neg    Ketones, UA neg     Spec Grav, UA 1.010 1.010 - 1.025   Blood, UA positive    pH, UA 5.0 5.0 - 8.0   POC,PROTEIN,UA Small (1+) Negative, Trace, Small (1+), Moderate (2+), Large (3+), 4+   Urobilinogen, UA 0.2 0.2 or 1.0 E.U./dL   Nitrite, UA neg    Leukocytes, UA Moderate (2+) (A) Negative   Appearance     Odor      ASSESSMENT/PLAN:   Acute cystitis  Problem List Items Addressed This Visit    Dysuria    -  Primary   Relevant Medications   sulfamethoxazole-trimethoprim (BACTRIM DS) 800-160 MG tablet   flavoxATE (URISPAS) 100 MG tablet   Other Relevant Orders   POC Urinalysis Dipstick OB (Completed)   Acute cystitis without hematuria       Relevant Medications   sulfamethoxazole-trimethoprim (BACTRIM DS) 800-160 MG tablet   flavoxATE (URISPAS) 100 MG tablet      Barnett Applebaum, MD, [facility], [facility] 11/04/2018  2:29 PM

## 2018-11-04 NOTE — Telephone Encounter (Signed)
Patient is calling with Symptoms of UTI and is wanting to know if she can be seen. Patient had surgery on the 11/01/18 with [facility]. Please advise Work IN.

## 2018-11-04 NOTE — Telephone Encounter (Signed)
Pt aware.

## 2018-11-04 NOTE — Telephone Encounter (Signed)
Medication not covered my medicaid. Is there anything else we can send in?

## 2018-11-04 NOTE — Telephone Encounter (Signed)
Try AZO over the counter in place of Urispas. Bactrim should be covered

## 2018-11-14 ENCOUNTER — Other Ambulatory Visit: Payer: Self-pay

## 2018-11-14 ENCOUNTER — Encounter: Payer: Self-pay | Admitting: Obstetrics & Gynecology

## 2018-11-14 ENCOUNTER — Ambulatory Visit (INDEPENDENT_AMBULATORY_CARE_PROVIDER_SITE_OTHER): Payer: Medicaid Other | Admitting: Obstetrics & Gynecology

## 2018-11-14 VITALS — BP 120/80 | Ht 69.0 in | Wt 224.0 lb

## 2018-11-14 DIAGNOSIS — R300 Dysuria: Secondary | ICD-10-CM | POA: Diagnosis not present

## 2018-11-14 DIAGNOSIS — Z90710 Acquired absence of both cervix and uterus: Secondary | ICD-10-CM

## 2018-11-14 DIAGNOSIS — D219 Benign neoplasm of connective and other soft tissue, unspecified: Secondary | ICD-10-CM

## 2018-11-14 LAB — POCT URINALYSIS DIPSTICK
Bilirubin, UA: NEGATIVE
Blood, UA: POSITIVE
Glucose, UA: NEGATIVE
Ketones, UA: NEGATIVE
Nitrite, UA: NEGATIVE
Protein, UA: NEGATIVE
Spec Grav, UA: 1.01 (ref 1.010–1.025)
Urobilinogen, UA: 0.2 E.U./dL
pH, UA: 5 (ref 5.0–8.0)

## 2018-11-14 MED ORDER — CEPHALEXIN 500 MG PO CAPS: 500.0000 mg | ORAL_CAPSULE | Freq: Four times a day (QID) | ORAL | 2 refills | Status: DC

## 2018-11-14 NOTE — Progress Notes (Signed)
  Postoperative Follow-up Patient presents post op from [facility] BSO for fibroids, 2 weeks ago. Path: DIAGNOSIS:  A. UTERUS AND CERVIX WITH BILATERAL TUBES AND OVARIES, TOTAL  HYSTERECTOMY WITH BILATERAL SALPINGO-OOPHORECTOMY:  - CERVIX: NEGATIVE FOR DYSPLASIA AND MALIGNANCY.  - ENDOMETRIUM: SECRETORY, WITH STROMAL BREAKDOWN. NEGATIVE FOR  ATYPIA/EIN AND MALIGNANCY.  - MYOMETRIUM: LEIOMYOMATA.  - LEFT FALLOPIAN TUBE: ENDOMETRIOSIS.  - RIGHT FALLOPIAN TUBE: UNREMARKABLE.  - BILATERAL OVARIES: UNREMARKABLE.  Images: Subjective: Patient reports marked improvement in her preop symptoms. Eating a regular diet without difficulty. The patient is not having any pain.  Activity: normal activities of daily living. Patient reports additional symptom's since surgery of None.  Objective: BP 120/80   Ht 5\' 9"  (1.753 m)   Wt 224 lb (101.6 kg)   BMI 33.08 kg/m  Physical Exam Constitutional:      General: She is not in acute distress.    Appearance: She is well-developed.  Cardiovascular:     Rate and Rhythm: Normal rate.  Pulmonary:     Effort: Pulmonary effort is normal.  Abdominal:     General: There is no distension.     Palpations: Abdomen is soft.     Tenderness: There is no abdominal tenderness.     Comments: Incision Healing Well   Musculoskeletal: Normal range of motion.  Neurological:     Mental Status: She is alert and oriented to person, place, and time.     Cranial Nerves: No cranial nerve deficit.  Skin:    General: Skin is warm and dry.    UA POS LEUKOCYTES  Assessment: s/p :  [facility] BSO progressing well Also, UTI  Plan: Patient has done well after surgery with no apparent complications.  I have discussed the post-operative course to date, and the expected progress moving forward.  The patient understands what complications to be concerned about.  I will see the patient in routine follow up, or sooner if needed.    Activity plan: No restriction except Pelvic rest.  UTI-  tx w ABX (change to Keflex, prior Bactrim but sx's never resolved)  Hoyt Koch 11/14/2018, 11:02 AM

## 2018-12-12 ENCOUNTER — Ambulatory Visit (INDEPENDENT_AMBULATORY_CARE_PROVIDER_SITE_OTHER): Payer: Medicaid Other | Admitting: Obstetrics & Gynecology

## 2018-12-12 ENCOUNTER — Other Ambulatory Visit: Payer: Self-pay

## 2018-12-12 ENCOUNTER — Encounter: Payer: Self-pay | Admitting: Obstetrics & Gynecology

## 2018-12-12 VITALS — BP 122/80 | Ht 69.0 in | Wt 222.0 lb

## 2018-12-12 DIAGNOSIS — D219 Benign neoplasm of connective and other soft tissue, unspecified: Secondary | ICD-10-CM

## 2018-12-12 DIAGNOSIS — Z90710 Acquired absence of both cervix and uterus: Secondary | ICD-10-CM

## 2018-12-12 NOTE — Progress Notes (Signed)
  Postoperative Follow-up Patient presents post op from [facility] BSO for fibroids, 6 weeks ago.  Subjective: Patient reports marked improvement in her preop symptoms. Eating a regular diet without difficulty. The patient is not having any pain.  Activity: normal activities of daily living. Patient reports additional symptom's since surgery of None.  Objective: BP 122/80   Ht 5\' 9"  (1.753 m)   Wt 222 lb (100.7 kg)   BMI 32.78 kg/m  Physical Exam Constitutional:      General: She is not in acute distress.    Appearance: She is well-developed.  Genitourinary:     Pelvic exam was performed with patient supine.     Vagina and rectum normal.     No vaginal erythema or bleeding.     No right or left adnexal mass present.     Right adnexa not tender.     Left adnexa not tender.     Genitourinary Comments: Cervix and uterus absent. Vaginal cuff healing well.  Cardiovascular:     Rate and Rhythm: Normal rate.  Pulmonary:     Effort: Pulmonary effort is normal.  Abdominal:     General: There is no distension.     Palpations: Abdomen is soft.     Tenderness: There is no abdominal tenderness.     Comments: Incision healing well.  Musculoskeletal: Normal range of motion.  Neurological:     Mental Status: She is alert and oriented to person, place, and time.     Cranial Nerves: No cranial nerve deficit.  Skin:    General: Skin is warm and dry.     Assessment: s/p :  [facility] BSO progressing well  Plan: Patient has done well after surgery with no apparent complications.  I have discussed the post-operative course to date, and the expected progress moving forward.  The patient understands what complications to be concerned about.  I will see the patient in routine follow up, or sooner if needed.    Activity plan: No restriction.  Advised to have MMG done soon.  Hoyt Koch 12/12/2018, 9:57 AM

## 2018-12-12 NOTE — Patient Instructions (Signed)
Mammogram every year- due as soon as you can schedule    Call [phone] to schedule at [facility]

## 2018-12-26 ENCOUNTER — Other Ambulatory Visit: Payer: Self-pay | Admitting: Family

## 2018-12-26 DIAGNOSIS — Z1231 Encounter for screening mammogram for malignant neoplasm of breast: Secondary | ICD-10-CM

## 2020-01-12 IMAGING — US US PELVIS COMPLETE WITH TRANSVAGINAL
1 series · 13 of 25 positions shown · non-contrast
Comparison: 06/24/2016

CLINICAL DATA: Pelvic pain in a female for 1 year, uterine
leiomyoma



[Series 1: us pelvis complete with transvaginal · 0.25mm/px · 13 of 106 slices shown]
[im 1/106]
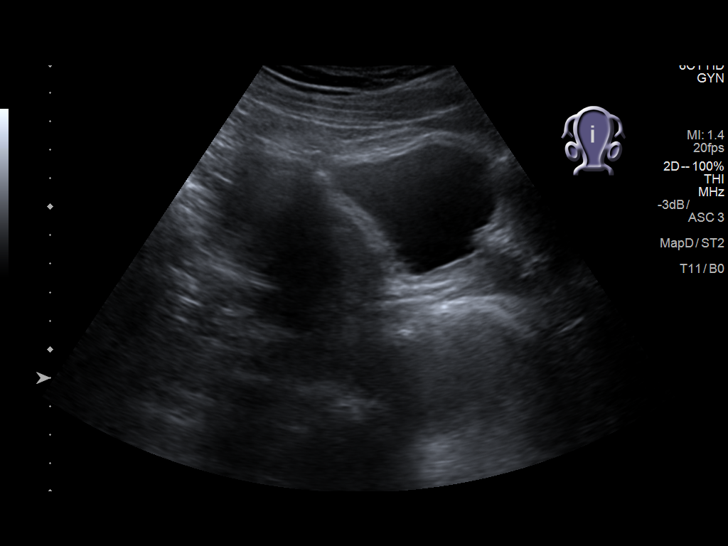
[im 9/106]
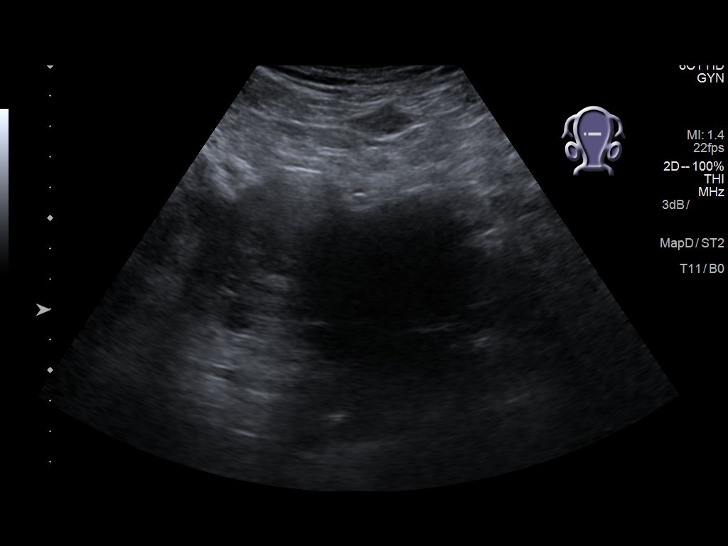
[im 18/106]
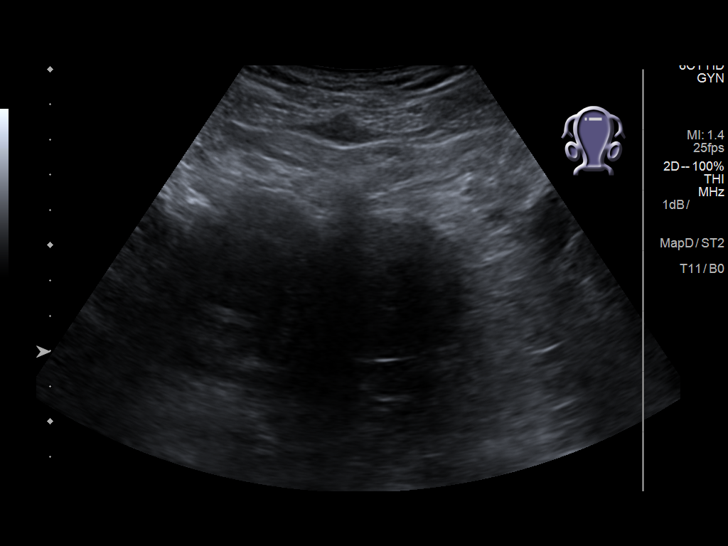
[im 27/106]
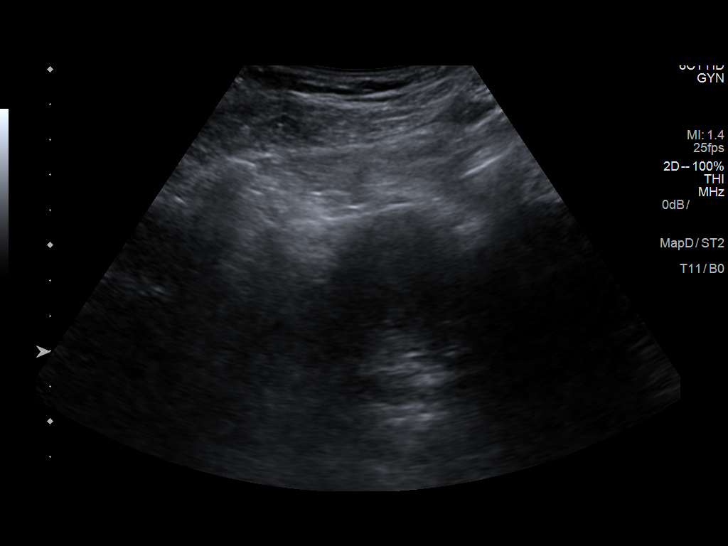
[im 36/106]
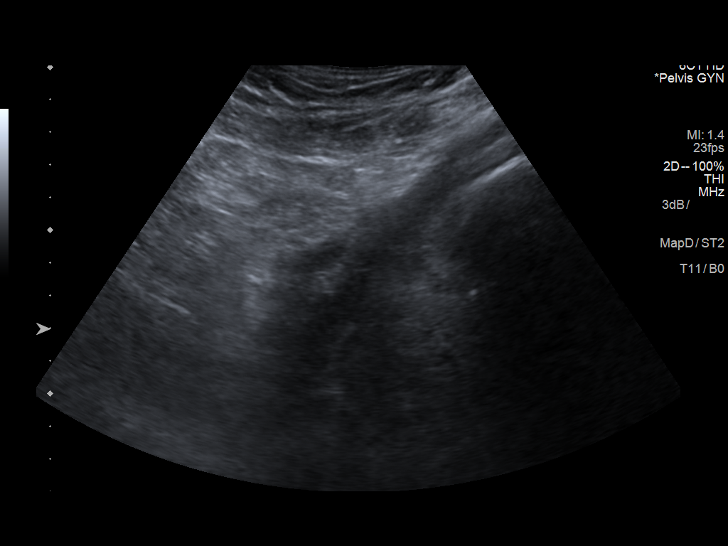
[im 44/106]
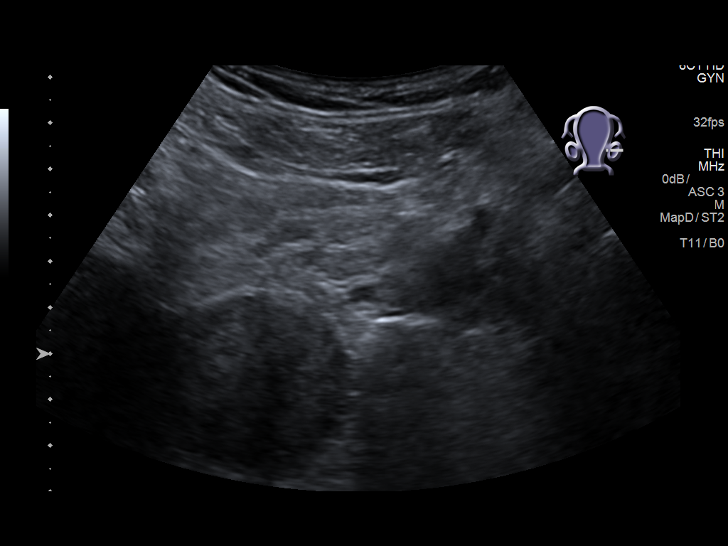
[im 53/106]
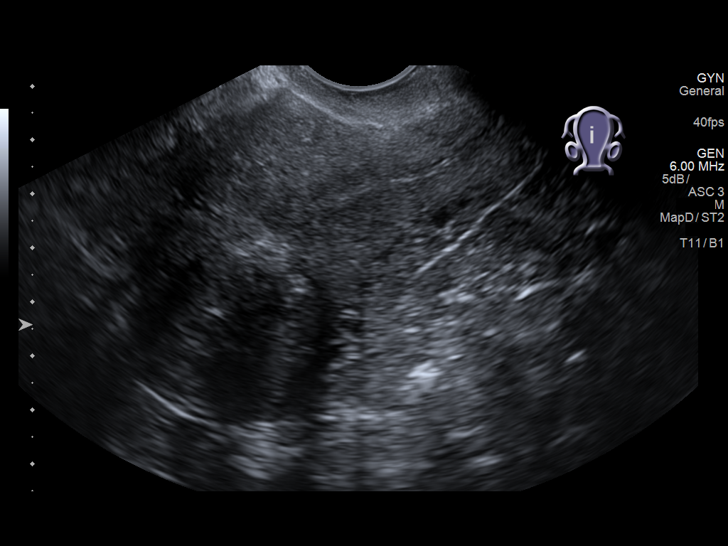
[im 62/106]
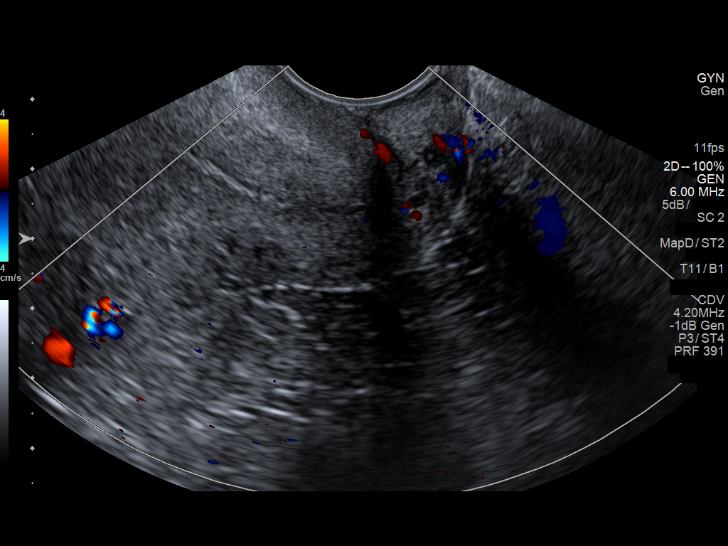
[im 71/106]
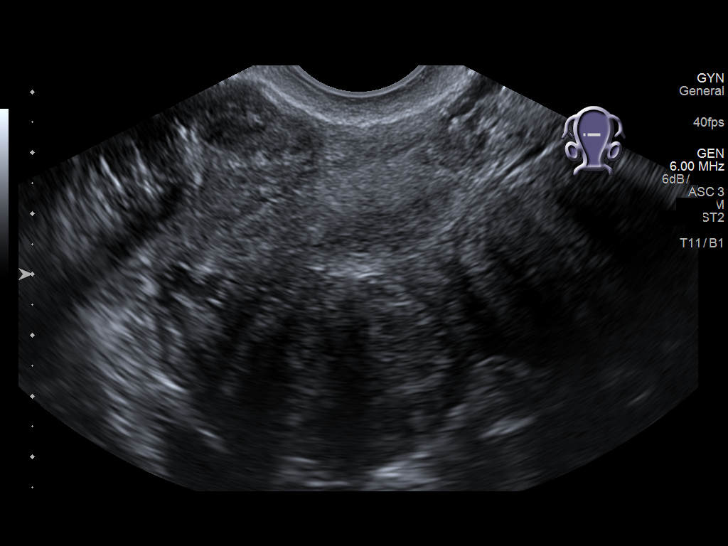
[im 79/106]
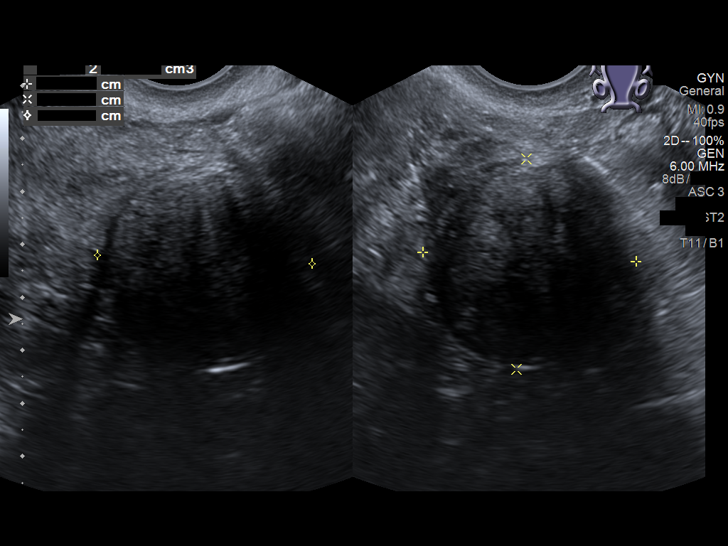
[im 88/106]
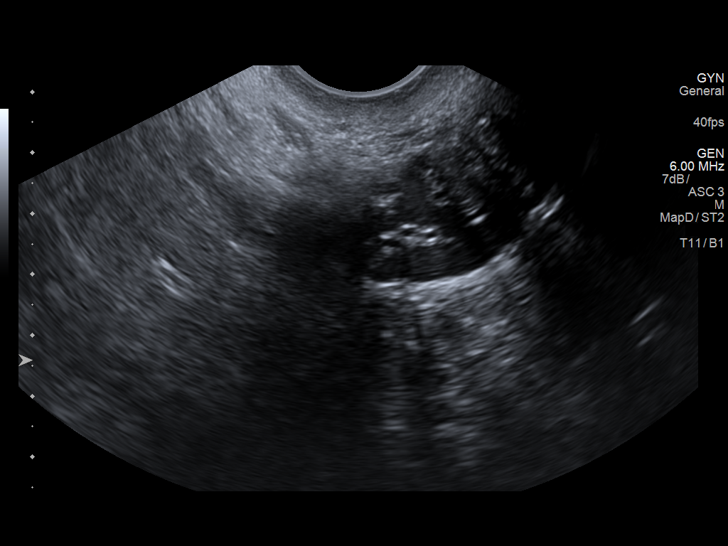
[im 97/106]
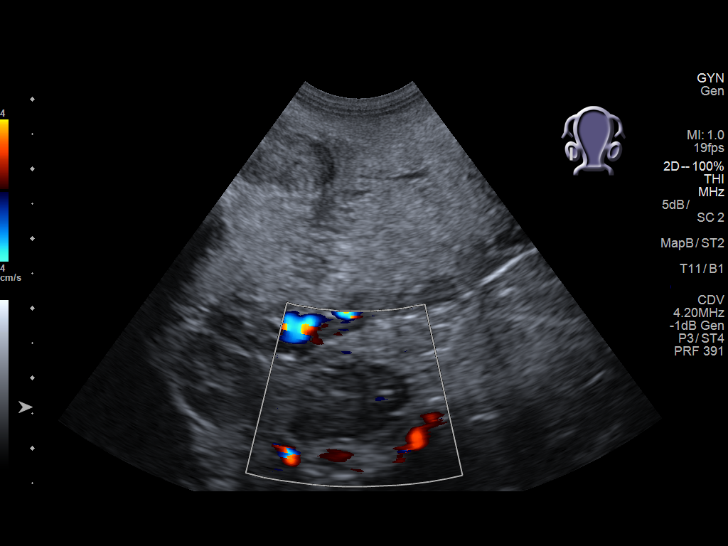
[im 106/106]
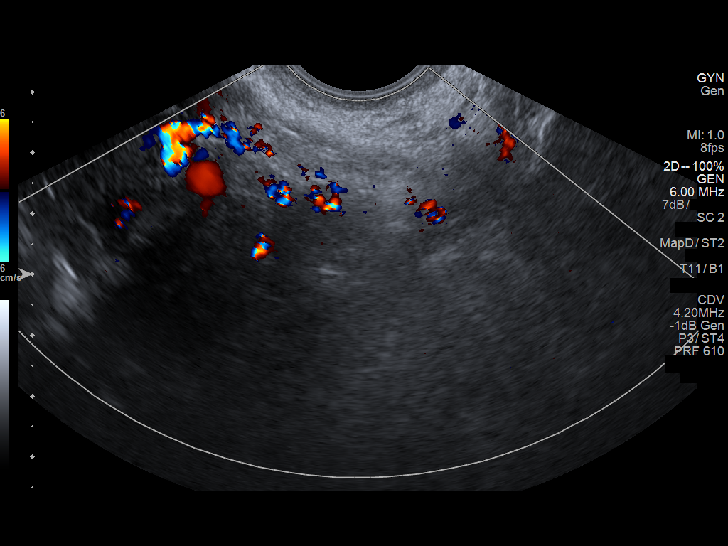

[13 of 25 positions shown; findings below may reference images not displayed]

FINDINGS: Uterus

Measurements: 8.7 x 5.4 x 9.6 cm = volume: 235 mL. Heterogeneous
myometrial echogenicity. Multiple uterine masses consistent with
leiomyomata. Largest of these include a posterior subserosal fundal
leiomyoma 4.1 x 3.5 x 4.0 cm slightly to RIGHT, posterior upper
uterine subserosal leiomyoma 4.0 x 4.0 x 4.1 cm slightly to LEFT,
and a subserosal leiomyoma at anterior fundus 1.7 x 1.1 x 2.0 cm.

Endometrium

Thickness: 4 mm. No endometrial fluid or focal abnormality.
Endometrial complex is not well visualized at the uterine fundus due
to distortion by leiomyomata.

Right ovary

Measurements: 1.8 x 1.2 x 1.6 cm = volume: 1.9 mL. Normal morphology
without mass

Left ovary

Measurements: 2.5 x 0.9 x 1.2 cm = volume: 1.3 mL. Normal morphology
without mass

Other findings

No free pelvic fluid or adnexal masses.
IMPRESSION: Three uterine leiomyomata are identified within an enlarged uterus
measuring up to 4.1 cm in greatest dimensions.

Endometrial complex not well seen at fundus due to distortion by
leiomyomata.

Unremarkable ovaries/adnexa.

## 2020-05-06 ENCOUNTER — Ambulatory Visit: Payer: Medicaid Other | Attending: Family

## 2020-06-05 ENCOUNTER — Ambulatory Visit: Payer: Medicaid Other | Admitting: Podiatry

## 2021-01-09 ENCOUNTER — Encounter
Admission: RE | Admit: 2021-01-09 | Discharge: 2021-01-09 | Disposition: A | Discharge status: Home / Self Care | Payer: Medicaid Other | Source: Ambulatory Visit | Attending: Podiatry | Admitting: Podiatry

## 2021-01-09 ENCOUNTER — Encounter: Payer: Self-pay | Admitting: *Deleted

## 2021-01-09 ENCOUNTER — Other Ambulatory Visit: Payer: Self-pay

## 2021-01-09 NOTE — Patient Instructions (Addendum)
Your procedure is scheduled on:01-17-21 Friday Report to the Registration Desk on the 1st floor of the [facility].Then proceed to the 2nd floor Surgery Desk in the [facility] To find out your arrival time, please call [phone] between 1PM - 3PM on:01-16-21 Thursday  REMEMBER: Instructions that are not followed completely may result in serious medical risk, up to and including death; or upon the discretion of your surgeon and anesthesiologist your surgery may need to be rescheduled.  Do not eat food after midnight the night before surgery.  No gum chewing, lozengers or hard candies.  You may however, drink CLEAR liquids up to 2 hours before you are scheduled to arrive for your surgery. Do not drink anything within 2 hours of your scheduled arrival time.  Clear liquids include: - water  - apple juice without pulp - gatorade (not RED, PURPLE, OR BLUE) - black coffee or tea (Do NOT add milk or creamers to the coffee or tea) Do NOT drink anything that is not on this list.  Do not take any medication the day of surgery  One week prior to surgery: Stop Anti-inflammatories (NSAIDS) such as Advil, Aleve, Ibuprofen, Motrin, Naproxen, Naprosyn and Aspirin based products such as Excedrin, Goodys Powder, BC Powder.You may however, take Tylenol if needed for pain up until the day of surgery.  Stop ANY OVER THE COUNTER supplements/vitamins NOW (01-09-21) until after surgery  No Alcohol for 24 hours before or after surgery.  No Smoking including e-cigarettes for 24 hours prior to surgery.  No chewable tobacco products for at least 6 hours prior to surgery.  No nicotine patches on the day of surgery.  Do not use any "recreational" drugs for at least a week prior to your surgery.  Please be advised that the combination of cocaine and anesthesia may have negative outcomes, up to and including death. If you test positive for cocaine, your surgery will be cancelled.  On the morning of surgery  brush your teeth with toothpaste and water, you may rinse your mouth with mouthwash if you wish. Do not swallow any toothpaste or mouthwash.  Use CHG Soap as directed on instruction sheet.  Do not wear jewelry, make-up, hairpins, clips or nail polish.  Do not wear lotions, powders, or perfumes.   Do not shave body from the neck down 48 hours prior to surgery just in case you cut yourself which could leave a site for infection.  Also, freshly shaved skin may become irritated if using the CHG soap.  Contact lenses, hearing aids and dentures may not be worn into surgery.  Do not bring valuables to the hospital. [facility] is not responsible for any missing/lost belongings or valuables.   Notify your doctor if there is any change in your medical condition (cold, fever, infection).  Wear comfortable clothing (specific to your surgery type) to the hospital.  After surgery, you can help prevent lung complications by doing breathing exercises.  Take deep breaths and cough every 1-2 hours. Your doctor may order a device called an Incentive Spirometer to help you take deep breaths. When coughing or sneezing, hold a pillow firmly against your incision with both hands. This is called "splinting." Doing this helps protect your incision. It also decreases belly discomfort.  If you are being admitted to the hospital overnight, leave your suitcase in the car. After surgery it may be brought to your room.  If you are being discharged the day of surgery, you will not be allowed to  drive home. You will need a responsible adult (18 years or older) to drive you home and stay with you that night.   If you are taking public transportation, you will need to have a responsible adult (18 years or older) with you. Please confirm with your physician that it is acceptable to use public transportation.   Please call the [place] Dept. at [phone] if you have any questions about these  instructions.  Surgery Visitation Policy:  Patients undergoing a surgery or procedure may have one family member or support person with them as long as that person is not COVID-19 positive or experiencing its symptoms.  That person may remain in the waiting area during the procedure and may rotate out with other people.  Inpatient Visitation:    Visiting hours are 7 a.m. to 8 p.m. Up to two visitors ages 16+ are allowed at one time in a patient room. The visitors may rotate out with other people during the day. Visitors must check out when they leave, or other visitors will not be allowed. One designated support person may remain overnight. The visitor must pass COVID-19 screenings, use hand sanitizer when entering and exiting the patient's room and wear a mask at all times, including in the patient's room. Patients must also wear a mask when staff or their visitor are in the room. Masking is required regardless of vaccination status.

## 2021-01-10 ENCOUNTER — Encounter
Admission: RE | Admit: 2021-01-10 | Discharge: 2021-01-10 | Disposition: A | Discharge status: Home / Self Care | Payer: Medicaid Other | Source: Ambulatory Visit | Attending: Podiatry | Admitting: Podiatry

## 2021-01-10 DIAGNOSIS — F17210 Nicotine dependence, cigarettes, uncomplicated: Secondary | ICD-10-CM | POA: Diagnosis not present

## 2021-01-10 DIAGNOSIS — Z01810 Encounter for preprocedural cardiovascular examination: Secondary | ICD-10-CM | POA: Diagnosis not present

## 2021-01-10 DIAGNOSIS — Z01812 Encounter for preprocedural laboratory examination: Secondary | ICD-10-CM | POA: Insufficient documentation

## 2021-01-10 DIAGNOSIS — I10 Essential (primary) hypertension: Secondary | ICD-10-CM | POA: Diagnosis not present

## 2021-01-14 ENCOUNTER — Other Ambulatory Visit: Payer: Self-pay | Admitting: Podiatry

## 2021-01-16 ENCOUNTER — Inpatient Hospital Stay
Admission: RE | Admit: 2021-01-16 | Discharge: 2021-01-16 | Disposition: A | Discharge status: Home / Self Care | Payer: Medicaid Other | Source: Ambulatory Visit

## 2021-01-17 ENCOUNTER — Encounter: Payer: Self-pay | Admitting: Podiatry

## 2021-01-17 ENCOUNTER — Ambulatory Visit
Admission: RE | Admit: 2021-01-17 | Discharge: 2021-01-17 | Disposition: A | Discharge status: Home / Self Care | Payer: Medicaid Other | Source: Ambulatory Visit | Attending: Podiatry | Admitting: Podiatry

## 2021-01-17 ENCOUNTER — Ambulatory Visit: Payer: Medicaid Other

## 2021-01-17 ENCOUNTER — Ambulatory Visit: Payer: Medicaid Other | Admitting: Urgent Care

## 2021-01-17 ENCOUNTER — Encounter
Admission: RE | Disposition: A | Discharge status: Home / Self Care | Payer: Self-pay | Source: Ambulatory Visit | Attending: Podiatry

## 2021-01-17 DIAGNOSIS — F17210 Nicotine dependence, cigarettes, uncomplicated: Secondary | ICD-10-CM | POA: Diagnosis not present

## 2021-01-17 DIAGNOSIS — I10 Essential (primary) hypertension: Secondary | ICD-10-CM | POA: Insufficient documentation

## 2021-01-17 DIAGNOSIS — M899 Disorder of bone, unspecified: Secondary | ICD-10-CM | POA: Insufficient documentation

## 2021-01-17 DIAGNOSIS — Z6832 Body mass index (BMI) 32.0-32.9, adult: Secondary | ICD-10-CM | POA: Diagnosis not present

## 2021-01-17 DIAGNOSIS — E669 Obesity, unspecified: Secondary | ICD-10-CM | POA: Diagnosis not present

## 2021-01-17 DIAGNOSIS — M2041 Other hammer toe(s) (acquired), right foot: Secondary | ICD-10-CM | POA: Insufficient documentation

## 2021-01-17 DIAGNOSIS — M7661 Achilles tendinitis, right leg: Secondary | ICD-10-CM | POA: Insufficient documentation

## 2021-01-17 HISTORY — PX: ACHILLES TENDON SURGERY: SHX542

## 2021-01-17 HISTORY — PX: OSTECTOMY: SHX6439

## 2021-01-17 SURGERY — REPAIR, TENDON, ACHILLES
Anesthesia: General | Site: Foot | Laterality: Right

## 2021-01-17 MED ORDER — LACTATED RINGERS IV SOLN: INTRAVENOUS | Status: DC

## 2021-01-17 MED ORDER — FENTANYL CITRATE (PF) 100 MCG/2ML IJ SOLN
INTRAMUSCULAR | Status: DC | PRN
  Administered 2021-01-17 (×3): 25 ug via INTRAVENOUS

## 2021-01-17 MED ORDER — ONDANSETRON HCL 4 MG/2ML IJ SOLN
INTRAMUSCULAR | Status: AC
  Filled 2021-01-17: qty 2

## 2021-01-17 MED ORDER — ORAL CARE MOUTH RINSE: 15.0000 mL | Freq: Once | OROMUCOSAL | Status: AC

## 2021-01-17 MED ORDER — MIDAZOLAM HCL 2 MG/2ML IJ SOLN
INTRAMUSCULAR | Status: AC
  Filled 2021-01-17: qty 2

## 2021-01-17 MED ORDER — FENTANYL CITRATE (PF) 100 MCG/2ML IJ SOLN
INTRAMUSCULAR | Status: AC
  Filled 2021-01-17: qty 2

## 2021-01-17 MED ORDER — OXYCODONE-ACETAMINOPHEN 5-325 MG PO TABS: 1.0000 | ORAL_TABLET | Freq: Four times a day (QID) | ORAL | 0 refills | Status: AC | PRN

## 2021-01-17 MED ORDER — CEFAZOLIN SODIUM-DEXTROSE 2-4 GM/100ML-% IV SOLN
INTRAVENOUS | Status: AC
  Filled 2021-01-17: qty 100

## 2021-01-17 MED ORDER — ROCURONIUM BROMIDE 10 MG/ML (PF) SYRINGE
PREFILLED_SYRINGE | INTRAVENOUS | Status: AC
  Filled 2021-01-17: qty 10

## 2021-01-17 MED ORDER — ONDANSETRON HCL 4 MG/2ML IJ SOLN
4.0000 mg | Freq: Once | INTRAMUSCULAR | Status: AC | PRN
  Administered 2021-01-17: 4 mg via INTRAVENOUS

## 2021-01-17 MED ORDER — OXYCODONE HCL 5 MG/5ML PO SOLN: 5.0000 mg | Freq: Once | ORAL | Status: DC | PRN

## 2021-01-17 MED ORDER — POVIDONE-IODINE 7.5 % EX SOLN
Freq: Once | CUTANEOUS | Status: DC
  Filled 2021-01-17: qty 118

## 2021-01-17 MED ORDER — SUGAMMADEX SODIUM 200 MG/2ML IV SOLN
INTRAVENOUS | Status: DC | PRN
  Administered 2021-01-17: 200 mg via INTRAVENOUS

## 2021-01-17 MED ORDER — PHENYLEPHRINE HCL (PRESSORS) 10 MG/ML IV SOLN
INTRAVENOUS | Status: DC | PRN
  Administered 2021-01-17: 160 ug via INTRAVENOUS
  Administered 2021-01-17: 80 ug via INTRAVENOUS
  Administered 2021-01-17 (×2): 160 ug via INTRAVENOUS
  Administered 2021-01-17: 80 ug via INTRAVENOUS
  Administered 2021-01-17: 160 ug via INTRAVENOUS

## 2021-01-17 MED ORDER — ROCURONIUM BROMIDE 100 MG/10ML IV SOLN
INTRAVENOUS | Status: DC | PRN
  Administered 2021-01-17: 50 mg via INTRAVENOUS
  Administered 2021-01-17: 20 mg via INTRAVENOUS

## 2021-01-17 MED ORDER — MIDAZOLAM HCL 2 MG/2ML IJ SOLN
INTRAMUSCULAR | Status: DC | PRN
  Administered 2021-01-17: 2 mg via INTRAVENOUS

## 2021-01-17 MED ORDER — CHLORHEXIDINE GLUCONATE 0.12 % MT SOLN
15.0000 mL | Freq: Once | OROMUCOSAL | Status: AC
  Administered 2021-01-17: 15 mL via OROMUCOSAL

## 2021-01-17 MED ORDER — LIDOCAINE HCL (PF) 2 % IJ SOLN
INTRAMUSCULAR | Status: AC
  Filled 2021-01-17: qty 5

## 2021-01-17 MED ORDER — PROPOFOL 10 MG/ML IV BOLUS
INTRAVENOUS | Status: AC
  Filled 2021-01-17: qty 20

## 2021-01-17 MED ORDER — CHLORHEXIDINE GLUCONATE 0.12 % MT SOLN
OROMUCOSAL | Status: AC
  Filled 2021-01-17: qty 15

## 2021-01-17 MED ORDER — BUPIVACAINE-EPINEPHRINE (PF) 0.25% -1:200000 IJ SOLN
INTRAMUSCULAR | Status: AC
  Filled 2021-01-17: qty 30

## 2021-01-17 MED ORDER — FAMOTIDINE 20 MG PO TABS
ORAL_TABLET | ORAL | Status: AC
  Filled 2021-01-17: qty 1

## 2021-01-17 MED ORDER — BUPIVACAINE HCL (PF) 0.5 % IJ SOLN
INTRAMUSCULAR | Status: AC
  Filled 2021-01-17: qty 30

## 2021-01-17 MED ORDER — FAMOTIDINE 20 MG PO TABS
20.0000 mg | ORAL_TABLET | Freq: Once | ORAL | Status: AC
  Administered 2021-01-17: 20 mg via ORAL

## 2021-01-17 MED ORDER — BUPIVACAINE LIPOSOME 1.3 % IJ SUSP
INTRAMUSCULAR | Status: AC
  Filled 2021-01-17: qty 20

## 2021-01-17 MED ORDER — DEXAMETHASONE SODIUM PHOSPHATE 10 MG/ML IJ SOLN
INTRAMUSCULAR | Status: AC
  Filled 2021-01-17: qty 1

## 2021-01-17 MED ORDER — 0.9 % SODIUM CHLORIDE (POUR BTL) OPTIME
TOPICAL | Status: DC | PRN
  Administered 2021-01-17: 500 mL

## 2021-01-17 MED ORDER — ACETAMINOPHEN 10 MG/ML IV SOLN: 1000.0000 mg | Freq: Once | INTRAVENOUS | Status: DC | PRN

## 2021-01-17 MED ORDER — PROPOFOL 10 MG/ML IV BOLUS
INTRAVENOUS | Status: DC | PRN
  Administered 2021-01-17: 170 mg via INTRAVENOUS

## 2021-01-17 MED ORDER — OXYCODONE HCL 5 MG PO TABS: 5.0000 mg | ORAL_TABLET | Freq: Once | ORAL | Status: DC | PRN

## 2021-01-17 MED ORDER — ACETAMINOPHEN 10 MG/ML IV SOLN
INTRAVENOUS | Status: DC | PRN
  Administered 2021-01-17: 1000 mg via INTRAVENOUS

## 2021-01-17 MED ORDER — FENTANYL CITRATE (PF) 100 MCG/2ML IJ SOLN: 25.0000 ug | INTRAMUSCULAR | Status: DC | PRN

## 2021-01-17 MED ORDER — KETOROLAC TROMETHAMINE 30 MG/ML IJ SOLN
INTRAMUSCULAR | Status: AC
  Filled 2021-01-17: qty 1

## 2021-01-17 MED ORDER — ONDANSETRON HCL 4 MG/2ML IJ SOLN
INTRAMUSCULAR | Status: DC | PRN
  Administered 2021-01-17: 4 mg via INTRAVENOUS

## 2021-01-17 MED ORDER — ONDANSETRON HCL 4 MG/2ML IJ SOLN: 4.0000 mg | Freq: Once | INTRAMUSCULAR | Status: AC

## 2021-01-17 MED ORDER — LIDOCAINE HCL (PF) 1 % IJ SOLN
INTRAMUSCULAR | Status: AC
  Filled 2021-01-17: qty 30

## 2021-01-17 MED ORDER — LIDOCAINE HCL (CARDIAC) PF 100 MG/5ML IV SOSY
PREFILLED_SYRINGE | INTRAVENOUS | Status: DC | PRN
  Administered 2021-01-17: 100 mg via INTRAVENOUS

## 2021-01-17 MED ORDER — ACETAMINOPHEN 10 MG/ML IV SOLN
INTRAVENOUS | Status: AC
  Filled 2021-01-17: qty 100

## 2021-01-17 MED ORDER — CEFAZOLIN SODIUM-DEXTROSE 2-4 GM/100ML-% IV SOLN
2.0000 g | INTRAVENOUS | Status: AC
  Administered 2021-01-17: 2 g via INTRAVENOUS

## 2021-01-17 MED ORDER — BUPIVACAINE-EPINEPHRINE (PF) 0.25% -1:200000 IJ SOLN
INTRAMUSCULAR | Status: DC | PRN
  Administered 2021-01-17: 20 mL via INTRAMUSCULAR

## 2021-01-17 MED ORDER — DEXAMETHASONE SODIUM PHOSPHATE 10 MG/ML IJ SOLN
INTRAMUSCULAR | Status: DC | PRN
  Administered 2021-01-17: 4 mg via INTRAVENOUS

## 2021-01-17 SURGICAL SUPPLY — 69 items
ANCH SUT 4.5 FTPRNT PEEK-OPTM (Anchor) ×1 IMPLANT
ANCHOR 4.5 FOOTPRINT ULTRA (Anchor) ×2 IMPLANT
BIT DRILL 4X4.5 FOOTPRINT STR (BIT) ×1 IMPLANT
BLADE SURG 15 STRL LF DISP TIS (BLADE) ×1 IMPLANT
BLADE SURG 15 STRL SS (BLADE) ×2
BLADE SURG MINI STRL (BLADE) ×2 IMPLANT
BNDG CMPR STD VLCR NS LF 5.8X4 (GAUZE/BANDAGES/DRESSINGS) ×2
BNDG COHESIVE 4X5 TAN ST LF (GAUZE/BANDAGES/DRESSINGS) ×2 IMPLANT
BNDG CONFORM 2 STRL LF (GAUZE/BANDAGES/DRESSINGS) ×2 IMPLANT
BNDG CONFORM 3 STRL LF (GAUZE/BANDAGES/DRESSINGS) ×2 IMPLANT
BNDG ELASTIC 4X5.8 VLCR NS LF (GAUZE/BANDAGES/DRESSINGS) ×4 IMPLANT
BNDG ELASTIC 4X5.8 VLCR STR LF (GAUZE/BANDAGES/DRESSINGS) ×2 IMPLANT
BNDG ESMARK 4X12 TAN STRL LF (GAUZE/BANDAGES/DRESSINGS) ×2 IMPLANT
BNDG ESMARK 6X12 TAN STRL LF (GAUZE/BANDAGES/DRESSINGS) ×2 IMPLANT
BNDG GAUZE ELAST 4 BULKY (GAUZE/BANDAGES/DRESSINGS) ×4 IMPLANT
BOOT STEPPER DURA LG (SOFTGOODS) ×2 IMPLANT
DRAPE FLUOR MINI C-ARM 54X84 (DRAPES) ×2 IMPLANT
DRILL 4X4.5 FOOTPRINT STR (BIT) ×2
DURAPREP 26ML APPLICATOR (WOUND CARE) ×2 IMPLANT
ELECT REM PT RETURN 9FT ADLT (ELECTROSURGICAL) ×2
ELECTRODE REM PT RTRN 9FT ADLT (ELECTROSURGICAL) ×1 IMPLANT
GAUZE 4X4 16PLY ~~LOC~~+RFID DBL (SPONGE) ×2 IMPLANT
GAUZE SPONGE 4X4 12PLY STRL (GAUZE/BANDAGES/DRESSINGS) ×2 IMPLANT
GAUZE XEROFORM 1X8 LF (GAUZE/BANDAGES/DRESSINGS) ×2 IMPLANT
GLOVE SURG ENC MOIS LTX SZ7.5 (GLOVE) ×2 IMPLANT
GLOVE SURG UNDER LTX SZ8 (GLOVE) ×2 IMPLANT
GOWN STRL REUS W/ TWL XL LVL3 (GOWN DISPOSABLE) ×2 IMPLANT
GOWN STRL REUS W/TWL XL LVL3 (GOWN DISPOSABLE) ×4
HANDLE YANKAUER SUCT BULB TIP (MISCELLANEOUS) ×2 IMPLANT
KIT TURNOVER KIT A (KITS) ×2 IMPLANT
LABEL OR SOLS (LABEL) IMPLANT
MANIFOLD NEPTUNE II (INSTRUMENTS) ×2 IMPLANT
NDL MAYO CATGUT SZ5 (NEEDLE) ×2
NDL SUT 5 .5 CRC TPR PNT MAYO (NEEDLE) ×1 IMPLANT
NEEDLE FILTER BLUNT 18X 1/2SAF (NEEDLE) ×1
NEEDLE FILTER BLUNT 18X1 1/2 (NEEDLE) ×1 IMPLANT
NEEDLE HYPO 25X1 1.5 SAFETY (NEEDLE) ×6 IMPLANT
NS IRRIG 500ML POUR BTL (IV SOLUTION) ×2 IMPLANT
PACK EXTREMITY ARMC (MISCELLANEOUS) ×2 IMPLANT
RASP SM TEAR CROSS CUT (RASP) ×2 IMPLANT
SPLINT CAST 1 STEP 5X30 WHT (MISCELLANEOUS) ×2 IMPLANT
SPLINT FAST PLASTER 5X30 (CAST SUPPLIES) ×1
SPLINT PLASTER CAST FAST 5X30 (CAST SUPPLIES) ×1 IMPLANT
SPONGE T-LAP 18X18 ~~LOC~~+RFID (SPONGE) ×2 IMPLANT
STOCKINETTE M/LG 89821 (MISCELLANEOUS) ×2 IMPLANT
STRIP CLOSURE SKIN 1/2X4 (GAUZE/BANDAGES/DRESSINGS) ×2 IMPLANT
SUCTION FRAZIER HANDLE 10FR (MISCELLANEOUS) ×1
SUCTION TUBE FRAZIER 10FR DISP (MISCELLANEOUS) ×1 IMPLANT
SUT MNCRL 4-0 (SUTURE) ×4
SUT MNCRL 4-0 VIOLET P-3 (SUTURE) ×1 IMPLANT
SUT MNCRL 4-018XMFL (SUTURE) ×2
SUT MONOCRYL 4-0 (SUTURE) ×1
SUT PDS AB 0 CT1 27 (SUTURE) IMPLANT
SUT ULTRABRAID #2 38 (SUTURE) ×2 IMPLANT
SUT VIC AB 0 SH 27 (SUTURE) ×2 IMPLANT
SUT VIC AB 2-0 SH 27 (SUTURE) ×4
SUT VIC AB 2-0 SH 27XBRD (SUTURE) ×2 IMPLANT
SUT VIC AB 3-0 SH 27 (SUTURE) ×2
SUT VIC AB 3-0 SH 27X BRD (SUTURE) ×1 IMPLANT
SUT VIC AB 4-0 FS2 27 (SUTURE) ×2 IMPLANT
SUT VICRYL 2-0 SH 8X27 (SUTURE) ×2 IMPLANT
SUT VICRYL 3-0 27IN SH (SUTURE) ×2 IMPLANT
SUT VICRYL AB 3-0 FS1 BRD 27IN (SUTURE) ×2 IMPLANT
SUTURE MNCRL 4-018XMF (SUTURE) ×2 IMPLANT
SWABSTK COMLB BENZOIN TINCTURE (MISCELLANEOUS) ×2 IMPLANT
SYR 10ML LL (SYRINGE) ×4 IMPLANT
SYR 3ML LL SCALE MARK (SYRINGE) ×2 IMPLANT
WAND TOPAZ MICRO DEBRIDER (MISCELLANEOUS) ×2 IMPLANT
WATER STERILE IRR 500ML POUR (IV SOLUTION) ×2 IMPLANT

## 2021-01-17 NOTE — Anesthesia Procedure Notes (Signed)
Procedure Name: Intubation Date/Time: 01/17/2021 10:26 AM Performed by: Lia Foyer, CRNA Pre-anesthesia Checklist: Patient identified, Emergency Drugs available, Suction available and Patient being monitored Patient Re-evaluated:Patient Re-evaluated prior to induction Oxygen Delivery Method: Circle system utilized Preoxygenation: Pre-oxygenation with 100% oxygen Induction Type: IV induction Ventilation: Mask ventilation without difficulty Laryngoscope Size: McGraph and 3 Grade View: Grade I Tube type: Oral Tube size: 7.0 mm Number of attempts: 1 Airway Equipment and Method: Stylet and Video-laryngoscopy Placement Confirmation: ETT inserted through vocal cords under direct vision, positive ETCO2 and breath sounds checked- equal and bilateral Secured at: 21 cm Tube secured with: Tape Dental Injury: Teeth and Oropharynx as per pre-operative assessment

## 2021-01-17 NOTE — OR Nursing (Signed)
Dr. Vickki Muff notified with patient family phone number to give report on surgery.  Also patient may need nausea medication for home

## 2021-01-17 NOTE — Anesthesia Postprocedure Evaluation (Signed)
Anesthesia Post Note  Patient: Teresa Manning  Procedure(s) Performed: ACHILLES TENDON REPAIR (Right: Foot) CALCANEAL EXOSTECTOMY NAD RIGHT THIRD TOE FLEXOR TENOTOMY (Right: Foot)  Patient location during evaluation: PACU Anesthesia Type: General Level of consciousness: awake and alert, oriented and patient cooperative Pain management: pain level controlled Vital Signs Assessment: post-procedure vital signs reviewed and stable Respiratory status: spontaneous breathing, nonlabored ventilation and respiratory function stable Cardiovascular status: blood pressure returned to baseline and stable Postop Assessment: adequate PO intake Anesthetic complications: no   No notable events documented.   Last Vitals:  Vitals:   01/17/21 1230 01/17/21 1236  BP: (!) 160/86 (!) 160/86  Pulse: (!) 58 (!) 56  Resp: 15 12  Temp:  (!) 36.1 C  SpO2: 94% 96%    Last Pain:  Vitals:   01/17/21 1236  TempSrc:   PainSc: 0-No pain                 Darrin Nipper

## 2021-01-17 NOTE — Anesthesia Preprocedure Evaluation (Signed)
Anesthesia Evaluation  Patient identified by MRN, date of birth, ID band Patient awake    Reviewed: Allergy & Precautions, NPO status , Patient's Chart, lab work & pertinent test results  History of Anesthesia Complications Negative for: history of anesthetic complications  Airway Mallampati: IV   Neck ROM: Full    Dental  (+) Edentulous Upper, Edentulous Lower   Pulmonary Current Smoker (1 ppd)Patient did not abstain from smoking.,    Pulmonary exam normal breath sounds clear to auscultation       Cardiovascular hypertension, Normal cardiovascular exam Rhythm:Regular Rate:Normal  ECG 01/10/21: sinus bradycardia (HR 53), otherwise normal   Neuro/Psych negative neurological ROS     GI/Hepatic negative GI ROS,   Endo/Other  Obesity   Renal/GU negative Renal ROS     Musculoskeletal   Abdominal   Peds  Hematology negative hematology ROS (+)   Anesthesia Other Findings   Reproductive/Obstetrics                             Anesthesia Physical Anesthesia Plan  ASA: 2  Anesthesia Plan: General   Post-op Pain Management:    Induction: Intravenous  PONV Risk Score and Plan: 2 and Ondansetron, Dexamethasone and Treatment may vary due to age or medical condition  Airway Management Planned: Oral ETT  Additional Equipment:   Intra-op Plan:   Post-operative Plan: Extubation in OR  Informed Consent: I have reviewed the patients History and Physical, chart, labs and discussed the procedure including the risks, benefits and alternatives for the proposed anesthesia with the patient or authorized representative who has indicated his/her understanding and acceptance.     Dental advisory given  Plan Discussed with: CRNA  Anesthesia Plan Comments: (Patient consented for risks of anesthesia including but not limited to:  - adverse reactions to medications - damage to eyes, teeth, lips or  other oral mucosa - nerve damage due to positioning  - sore throat or hoarseness - damage to heart, brain, nerves, lungs, other parts of body or loss of life  Informed patient about role of CRNA in peri- and intra-operative care.  Patient voiced understanding.)       Anesthesia Quick Evaluation

## 2021-01-17 NOTE — H&P (Signed)
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  The various methods of treatment have been discussed with the patient.  No guarantees were given.  After consideration of risks, benefits and other options for treatment, the patient has consented to surgery.  I have reviewed the patients' chart and labs.     A history and physical examination was performed in my office.  The patient was reexamined.  There have been no changes to this history and physical examination.  Samara Deist A

## 2021-01-17 NOTE — Discharge Instructions (Addendum)
These instructions will give you an idea of what to expect after surgery and how to manage issues that may arise before your first post op office visit.  Pain Management Pain is best managed by "staying ahead" of it. If pain gets out of control, it is difficult to get it back under control. Local anesthesia that lasts 6-8 hours is used to numb the foot and decrease pain.  For the best pain control, take the pain medication every 4 hours for the first 2 days post op. On the third day pain medication can be taken as needed.   Post Op Nausea Nausea is common after surgery, so it is managed proactively.  If prescribed, use the prescribed nausea medication regularly for the first 2 days post op.  Bandages Do not worry if there is blood on the bandage. What looks like a lot of blood on the bandage is actually a small amount. Blood on the dressing spreads out as it is absorbed by the gauze, the same way a drop of water spreads out on a paper towel.  If the bandages feel wet or dry, stiff and uncomfortable, call the office during office hours and we will schedule a time for you to have the bandage changed.  Unless you are specifically told otherwise, we will do the first bandage change in the office.  Keep your bandage dry. If the bandage becomes wet or soiled, notify the office and we will schedule a time to change the bandage.  Activity It is best to spend most of the first 2 days after surgery lying down with the foot elevated above the level of your heart. Do not apply weight to your foot.  You have been instructed to remain nonweightbearing.  Use your crutches or rolling knee scooter as needed. You may only get up to go to the restroom.  Driving Do not drive until you are able to respond in an emergency (i.e. slam on the brakes). This usually occurs after the bone has healed - 6 to 8 weeks.  Call the Office If you have a fever over 101F.  If you have increasing pain after the initial post op  pain has settled down.  If you have increasing redness, swelling, or drainage.  If you have any questions or concerns.    AMBULATORY SURGERY  DISCHARGE INSTRUCTIONS   The drugs that you were given will stay in your system until tomorrow so for the next 24 hours you should not:  Drive an automobile Make any legal decisions Drink any alcoholic beverage   You may resume regular meals tomorrow.  Today it is better to start with liquids and gradually work up to solid foods.  You may eat anything you prefer, but it is better to start with liquids, then soup and crackers, and gradually work up to solid foods.   Please notify your doctor immediately if you have any unusual bleeding, trouble breathing, redness and pain at the surgery site, drainage, fever, or pain not relieved by medication.    Your post-operative visit with Dr.                                       is: Date:                        Time:    Please call to schedule your post-operative  visit.  Additional Instructions:

## 2021-01-17 NOTE — Op Note (Signed)
Operative note   Surgeon:Lyrique Hakim Lawyer: None    Preop diagnosis: 1.  Posterior right Achilles insertional tendinitis 2.  Right calcaneal exostosis 3.  Hammertoe contracture right third toe    Postop diagnosis: Same    Procedure: 1.  Secondary Achilles tendon repair with debridement 2.  Calcaneal exostosis right posterior calcaneus 3.  Flexor tenotomy open type right third toe    EBL: Minimal    Anesthesia:local and general.  Local consisted of a one-to-one mixture of 0.25% bupivacaine with epinephrine and Exparel long-acting anesthetic.  A total of 20 cc was used throughout the entire surgical sites.    Hemostasis: Epinephrine infiltrated along the incision sites    Specimen: None    Complications: None    Operative indications:Teresa Manning is an 55 y.o. that presents today for surgical intervention.  The risks/benefits/alternatives/complications have been discussed and consent has been given.    Procedure:  Patient was brought into the OR and placed on the operating table in the prone position. After anesthesia was obtained theright lower extremity was prepped and draped in usual sterile fashion.  Attention was directed to the posterior aspect of the right heel.  A longitudinal incision was made at the Achilles insertion down to the distal Achilles insertion to the calcaneus.  Sharp and blunt dissection carried down to the peritenon.  Peritenon was incised.  A tendon splitting incision was made from the superior calcaneus to the plantar calcaneus.  Full-thickness flaps of the Achilles were made from medial to lateral.  A moderate posterior calcaneal exostosis was demonstrated at this time.  This was demonstrated on fluoroscopy as well.  Next a curved osteotome was used to remove all of the prominent calcaneal spur.  This was further contoured with a power rasp.  Good recontouring of the posterior calcaneus was noted at this time.  The wound was flushed with copious  amounts of irrigation.  At this time a 5.0 mm footprint bone anchor from [facility] was placed into the calcaneus and the Achilles was brought down and tacked into the bone anchor with a Magnum wire.  Further reanastomosis of the Achilles tendon was performed with a 2-0 Vicryl.  The Achilles was then repaired and further debrided with a Topaz wand.  The peritenon was closed with a 3-0 Vicryl and the subcutaneous tissue closed with a 4-0 Monocryl.  Attention was then directed to the plantar aspect of the right foot at the third toe.  A small stab incision was made.  Blunt dissection carried down to the long flexor tendon.  A tenotomy was performed.  At this time the skin was then closed with a single nylon suture.  A bulky dressing was applied to all areas.  The patient was placed in an equalizer walker boot for nonweightbearing.  Prescription for Percocet was sent to her pharmacy.    Patient tolerated the procedure and anesthesia well.  Was transported from the OR to the PACU with all vital signs stable and vascular status intact. To be discharged per routine protocol.  Will follow up in approximately 1 week in the outpatient clinic.

## 2021-01-17 NOTE — Transfer of Care (Signed)
Immediate Anesthesia Transfer of Care Note  Patient: Teresa Manning  Procedure(s) Performed: ACHILLES TENDON REPAIR (Right: Foot) CALCANEAL EXOSTECTOMY NAD RIGHT THIRD TOE FLEXOR TENOTOMY (Right: Foot)  Patient Location: PACU  Anesthesia Type:General  Level of Consciousness: drowsy and patient cooperative  Airway & Oxygen Therapy: Patient Spontanous Breathing and Patient connected to face mask oxygen  Post-op Assessment: Report given to RN and Post -op Vital signs reviewed and stable  Post vital signs: Reviewed and stable  Last Vitals:  Vitals Value Taken Time  BP 165/86 01/17/21 1157  Temp    Pulse 66 01/17/21 1200  Resp 22 01/17/21 1200  SpO2 100 % 01/17/21 1200  Vitals shown include unvalidated device data.  Last Pain:  Vitals:   01/17/21 0803  TempSrc: Temporal  PainSc: 0-No pain         Complications: No notable events documented.

## 2021-01-20 ENCOUNTER — Encounter: Payer: Self-pay | Admitting: Podiatry

## 2022-05-14 ENCOUNTER — Encounter: Payer: Self-pay | Admitting: Emergency Medicine

## 2022-05-14 ENCOUNTER — Emergency Department
Admission: EM | Admit: 2022-05-14 | Discharge: 2022-05-14 | Disposition: A | Discharge status: Home / Self Care | Payer: Medicaid Other | Attending: Emergency Medicine | Admitting: Emergency Medicine

## 2022-05-14 ENCOUNTER — Emergency Department: Payer: Medicaid Other

## 2022-05-14 DIAGNOSIS — R0981 Nasal congestion: Secondary | ICD-10-CM | POA: Diagnosis present

## 2022-05-14 DIAGNOSIS — R509 Fever, unspecified: Secondary | ICD-10-CM | POA: Diagnosis not present

## 2022-05-14 DIAGNOSIS — I10 Essential (primary) hypertension: Secondary | ICD-10-CM | POA: Diagnosis not present

## 2022-05-14 DIAGNOSIS — J101 Influenza due to other identified influenza virus with other respiratory manifestations: Secondary | ICD-10-CM | POA: Diagnosis not present

## 2022-05-14 DIAGNOSIS — Z1152 Encounter for screening for COVID-19: Secondary | ICD-10-CM | POA: Diagnosis not present

## 2022-05-14 LAB — CBC WITH DIFFERENTIAL/PLATELET
Abs Immature Granulocytes: 0.01 10*3/uL (ref 0.00–0.07)
Basophils Absolute: 0 10*3/uL (ref 0.0–0.1)
Basophils Relative: 0 %
Eosinophils Absolute: 0 10*3/uL (ref 0.0–0.5)
Eosinophils Relative: 1 %
HCT: 39.5 % (ref 36.0–46.0)
Hemoglobin: 13.2 g/dL (ref 12.0–15.0)
Immature Granulocytes: 0 %
Lymphocytes Relative: 6 %
Lymphs Abs: 0.3 10*3/uL — ABNORMAL LOW (ref 0.7–4.0)
MCH: 29.4 pg (ref 26.0–34.0)
MCHC: 33.4 g/dL (ref 30.0–36.0)
MCV: 88 fL (ref 80.0–100.0)
Monocytes Absolute: 0.4 10*3/uL (ref 0.1–1.0)
Monocytes Relative: 9 %
Neutro Abs: 3.9 10*3/uL (ref 1.7–7.7)
Neutrophils Relative %: 84 %
Platelets: 217 10*3/uL (ref 150–400)
RBC: 4.49 MIL/uL (ref 3.87–5.11)
RDW: 13 % (ref 11.5–15.5)
WBC: 4.7 10*3/uL (ref 4.0–10.5)
nRBC: 0 % (ref 0.0–0.2)

## 2022-05-14 LAB — RESP PANEL BY RT-PCR (RSV, FLU A&B, COVID)  RVPGX2
Influenza A by PCR: POSITIVE — AB
Influenza B by PCR: NEGATIVE
Resp Syncytial Virus by PCR: NEGATIVE
SARS Coronavirus 2 by RT PCR: NEGATIVE

## 2022-05-14 LAB — COMPREHENSIVE METABOLIC PANEL
ALT: 12 U/L (ref 0–44)
AST: 18 U/L (ref 15–41)
Albumin: 3.4 g/dL — ABNORMAL LOW (ref 3.5–5.0)
Alkaline Phosphatase: 78 U/L (ref 38–126)
Anion gap: 7 (ref 5–15)
BUN: 9 mg/dL (ref 6–20)
CO2: 23 mmol/L (ref 22–32)
Calcium: 8.2 mg/dL — ABNORMAL LOW (ref 8.9–10.3)
Chloride: 107 mmol/L (ref 98–111)
Creatinine, Ser: 0.77 mg/dL (ref 0.44–1.00)
GFR, Estimated: >60 mL/min (ref >60)
Glucose, Bld: 129 mg/dL — ABNORMAL HIGH (ref 70–99)
Potassium: 3.6 mmol/L (ref 3.5–5.1)
Sodium: 137 mmol/L (ref 135–145)
Total Bilirubin: 0.5 mg/dL (ref 0.3–1.2)
Total Protein: 6.6 g/dL (ref 6.5–8.1)

## 2022-05-14 MED ORDER — ACETAMINOPHEN 500 MG PO TABS
1000.0000 mg | ORAL_TABLET | Freq: Once | ORAL | Status: AC
Start: 2022-05-14 — End: 2022-05-14
  Administered 2022-05-14: 1000 mg via ORAL
  Filled 2022-05-14: qty 2

## 2022-05-14 MED ORDER — OSELTAMIVIR PHOSPHATE 75 MG PO CAPS: 75.0000 mg | ORAL_CAPSULE | Freq: Two times a day (BID) | ORAL | 0 refills | Status: AC

## 2022-05-14 MED ORDER — SODIUM CHLORIDE 0.9 % IV BOLUS
1000.0000 mL | Freq: Once | INTRAVENOUS | Status: AC
  Administered 2022-05-14: 1000 mL via INTRAVENOUS

## 2022-05-14 MED ORDER — KETOROLAC TROMETHAMINE 30 MG/ML IJ SOLN
15.0000 mg | Freq: Once | INTRAMUSCULAR | Status: AC
  Administered 2022-05-14: 15 mg via INTRAVENOUS
  Filled 2022-05-14: qty 1

## 2022-05-14 NOTE — ED Notes (Signed)
Rn at bedside to discuss discharge papers/instructions. Pt verbalized understanding.

## 2022-05-14 NOTE — Discharge Instructions (Signed)
Take Tamiflu as prescribed.  Drink plenty of fluids daily.  You may take Tylenol and/or Ibuprofen as needed for fever and bodyaches.  Return to the ER for worsening symptoms, persistent vomiting, difficulty breathing or other concerns.

## 2022-05-14 NOTE — ED Provider Notes (Signed)
[facility] Provider Note    Event Date/Time   First MD Initiated Contact with Patient 05/14/22 [phone]     (approximate)   History   Nasal Congestion   HPI  Teresa Manning is a 57 y.o. female who presents to the ED from home with a chief complaint of fever, nasal congestion, headache, body aches, nonproductive cough starting yesterday.  Denies associated chest pain, shortness of breath, abdominal pain, nausea, vomiting or diarrhea.     Past Medical History   Past Medical History:  Diagnosis Date   Hypertension    pt statets pcp put her on bp meds but she never took bc she does not have htn     Active Problem List   Patient Active Problem List   Diagnosis Date Noted   S/P laparoscopic hysterectomy 11/14/2018   Fibroids 10/24/2018   Pelvic pressure in female 10/24/2018   Stress incontinence of urine 10/24/2018     Past Surgical History   Past Surgical History:  Procedure Laterality Date   ACHILLES TENDON SURGERY Right 01/17/2021   Procedure: ACHILLES TENDON REPAIR;  Surgeon: Samara Deist, DPM;  Location: [facility] ORS;  Service: Podiatry;  Laterality: Right;   COLPOSCOPY     CYSTOSCOPY N/A 11/01/2018   Procedure: CYSTOSCOPY;  Surgeon: Gae Dry, MD;  Location: [facility] ORS;  Service: Gynecology;  Laterality: N/A;   OSTECTOMY Right 01/17/2021   Procedure: CALCANEAL EXOSTECTOMY NAD RIGHT THIRD TOE FLEXOR TENOTOMY;  Surgeon: Samara Deist, DPM;  Location: [facility] ORS;  Service: Podiatry;  Laterality: Right;   TOTAL LAPAROSCOPIC HYSTERECTOMY WITH BILATERAL SALPINGO OOPHORECTOMY N/A 11/01/2018   Procedure: TOTAL LAPAROSCOPIC HYSTERECTOMY WITH BILATERAL SALPINGO OOPHORECTOMY;  Surgeon: Gae Dry, MD;  Location: [facility] ORS;  Service: Gynecology;  Laterality: N/A;     Home Medications   Prior to Admission medications   Medication Sig Start Date End Date Taking? Authorizing Provider  oseltamivir (TAMIFLU) 75 MG capsule Take 1 capsule (75 mg  total) by mouth 2 (two) times daily. 05/14/22  Yes Paulette Blanch, MD  oxyCODONE-acetaminophen (PERCOCET) 5-325 MG tablet Take 1-2 tablets by mouth every 6 (six) hours as needed for severe pain. Max 6 tabs per day 01/17/21   Samara Deist, DPM     Allergies  Patient has no known allergies.   Family History   Family History  Problem Relation Age of Onset   Diabetes Mother    Hypertension Mother    Hyperlipidemia Mother      Physical Exam  Triage Vital Signs: ED Triage Vitals  Enc Vitals Group     BP 05/14/22 0450 (!) 144/86     Pulse Rate 05/14/22 0450 97     Resp 05/14/22 0450 18     Temp 05/14/22 0450 (!) 102 F (38.9 C)     Temp Source 05/14/22 0450 Oral     SpO2 05/14/22 0450 95 %     Weight 05/14/22 0454 198 lb (89.8 kg)     Height 05/14/22 0454 '5\' 9"'$  (1.753 m)     Head Circumference --      Peak Flow --      Pain Score 05/14/22 0454 6     Pain Loc --      Pain Edu? --      Excl. in [place]? --     Updated Vital Signs: BP (!) 144/86 (BP Location: Left Arm)   Pulse 97   Temp (!) 102 F (38.9 C) (Oral)   Resp 18  Ht '5\' 9"'$  (1.753 m)   Wt 89.8 kg   LMP 06/17/2016 (Exact Date)   SpO2 95%   BMI 29.24 kg/m    General: Awake, mild distress.  CV:  RRR.  Good peripheral perfusion.  Resp:  Normal effort.  CTAB. Abd:  Nontender to light or deep palpation.  No distention.  Other:  Supple neck without meningismus.  No petechiae.   ED Results / Procedures / Treatments  Labs (all labs ordered are listed, but only abnormal results are displayed) Labs Reviewed  RESP PANEL BY RT-PCR (RSV, FLU A&B, COVID)  RVPGX2 - Abnormal; Notable for the following components:      Result Value   Influenza A by PCR POSITIVE (*)    All other components within normal limits  CBC WITH DIFFERENTIAL/PLATELET - Abnormal; Notable for the following components:   Lymphs Abs 0.3 (*)    All other components within normal limits  COMPREHENSIVE METABOLIC PANEL - Abnormal; Notable for the  following components:   Glucose, Bld 129 (*)    Calcium 8.2 (*)    Albumin 3.4 (*)    All other components within normal limits     EKG  None   RADIOLOGY I have independently visualized and interpreted patient's x-ray as well as noted the radiology interpretation:  Chest x-ray: Interstitial or atypical inflammation  Official radiology report(s): DG Chest 2 View  Result Date: 05/14/2022 CLINICAL DATA:  Fever with cough and congestion. EXAM: CHEST - 2 VIEW COMPARISON:  04/21/2016 FINDINGS: Two views study shows no dense focal airspace consolidation or substantial pleural effusion. There is diffuse interstitial prominence. No pneumothorax. The cardiopericardial silhouette is within normal limits for size. The visualized bony structures of the thorax are unremarkable. IMPRESSION: Diffuse interstitial prominence without focal airspace consolidation or substantial pleural effusion. No overt signs of pulmonary edema. Interstitial or atypical infection/inflammation a consideration. Electronically Signed   By: Misty Stanley M.D.   On: 05/14/2022 05:37     PROCEDURES:  Critical Care performed: No  .1-3 Lead EKG Interpretation  Performed by: Paulette Blanch, MD Authorized by: Paulette Blanch, MD     Interpretation: normal     ECG rate:  95   ECG rate assessment: normal     Rhythm: sinus rhythm     Ectopy: none     Conduction: normal   Comments:     Patient placed on cardiac monitor to evaluate for arrhythmias    MEDICATIONS ORDERED IN ED: Medications  sodium chloride 0.9 % bolus 1,000 mL (1,000 mLs Intravenous New Bag/Given 05/14/22 0559)  ketorolac (TORADOL) 30 MG/ML injection 15 mg (15 mg Intravenous Given 05/14/22 0610)  acetaminophen (TYLENOL) tablet 1,000 mg (1,000 mg Oral Given 05/14/22 0610)     IMPRESSION / MDM / ASSESSMENT AND PLAN / ED COURSE  I reviewed the triage vital signs and the nursing notes.                             57 year old female presenting with cold-like  symptoms.  Differential diagnosis includes but is not limited to viral infection such as COVID-19, influenza, RSV, community-acquired pneumonia, etc.  I have personally reviewed patient's records and note a last podiatry office visit in September 2023.  Patient's presentation is most consistent with acute complicated illness / injury requiring diagnostic workup.  The patient is on the cardiac monitor to evaluate for evidence of arrhythmia and/or significant heart rate changes.  Will obtain  lab work, chest x-ray, respiratory panel.  Administer Tylenol for antipyretic.  Initiate IV fluid hydration, NSAIDs for body aches and reassess.  Clinical Course as of 05/14/22 0711  Thu May 14, 2022  0648 Patient positive for influenza A.  Will start Tamiflu.  Strict return precautions given.  Patient verbalizes understanding and agrees with plan of care. [JS]    Clinical Course User Index [JS] Paulette Blanch, MD     FINAL CLINICAL IMPRESSION(S) / ED DIAGNOSES   Final diagnoses:  Fever, unspecified fever cause  Influenza A     Rx / DC Orders   ED Discharge Orders          Ordered    oseltamivir (TAMIFLU) 75 MG capsule  2 times daily        05/14/22 [id number]             Note:  This document was prepared using Dragon voice recognition software and may include unintentional dictation errors.   Paulette Blanch, MD 05/14/22 [phone]

## 2022-05-14 NOTE — ED Triage Notes (Signed)
Pt presents ambulatory to triage via POV with complaints of nasal congestion that started last night with associated sweats and headache. No meds taken PTA. A&Ox4 at this time. Denies CP or SOB.

## 2022-06-12 ENCOUNTER — Other Ambulatory Visit: Payer: Self-pay

## 2022-06-12 ENCOUNTER — Emergency Department
Admission: EM | Admit: 2022-06-12 | Discharge: 2022-06-13 | Disposition: A | Discharge status: Home / Self Care | Payer: Medicaid Other | Attending: Emergency Medicine | Admitting: Emergency Medicine

## 2022-06-12 DIAGNOSIS — R0789 Other chest pain: Secondary | ICD-10-CM | POA: Insufficient documentation

## 2022-06-12 DIAGNOSIS — R051 Acute cough: Secondary | ICD-10-CM

## 2022-06-12 DIAGNOSIS — Z1152 Encounter for screening for COVID-19: Secondary | ICD-10-CM | POA: Insufficient documentation

## 2022-06-12 DIAGNOSIS — R059 Cough, unspecified: Secondary | ICD-10-CM | POA: Diagnosis not present

## 2022-06-12 NOTE — ED Triage Notes (Signed)
Pt to ED via POV c/o Chest pain. Pt reports pain is in left side of chest and radiates down left arm, hurting since Thursday. Pt states she had a virus on Wednesday. Pt denies any SOB, N/V, dizziness. Endorses fatigue.

## 2022-06-13 ENCOUNTER — Emergency Department: Payer: Medicaid Other

## 2022-06-13 LAB — BASIC METABOLIC PANEL
Anion gap: 9 (ref 5–15)
BUN: 10 mg/dL (ref 6–20)
CO2: 25 mmol/L (ref 22–32)
Calcium: 8.1 mg/dL — ABNORMAL LOW (ref 8.9–10.3)
Chloride: 102 mmol/L (ref 98–111)
Creatinine, Ser: 0.77 mg/dL (ref 0.44–1.00)
GFR, Estimated: >60 mL/min (ref >60)
Glucose, Bld: 98 mg/dL (ref 70–99)
Potassium: 3.7 mmol/L (ref 3.5–5.1)
Sodium: 136 mmol/L (ref 135–145)

## 2022-06-13 LAB — CBC
HCT: 42.8 % (ref 36.0–46.0)
Hemoglobin: 14.1 g/dL (ref 12.0–15.0)
MCH: 29.3 pg (ref 26.0–34.0)
MCHC: 32.9 g/dL (ref 30.0–36.0)
MCV: 88.8 fL (ref 80.0–100.0)
Platelets: 161 10*3/uL (ref 150–400)
RBC: 4.82 MIL/uL (ref 3.87–5.11)
RDW: 13 % (ref 11.5–15.5)
WBC: 5.3 10*3/uL (ref 4.0–10.5)
nRBC: 0 % (ref 0.0–0.2)

## 2022-06-13 LAB — TROPONIN I (HIGH SENSITIVITY)
Troponin I (High Sensitivity): 2 ng/L (ref <18)
Troponin I (High Sensitivity): 3 ng/L (ref <18)

## 2022-06-13 LAB — SARS CORONAVIRUS 2 BY RT PCR: SARS Coronavirus 2 by RT PCR: NEGATIVE

## 2022-06-13 MED ORDER — KETOROLAC TROMETHAMINE 30 MG/ML IJ SOLN
30.0000 mg | Freq: Once | INTRAMUSCULAR | Status: AC
  Administered 2022-06-13: 30 mg via INTRAMUSCULAR
  Filled 2022-06-13: qty 1

## 2022-06-13 NOTE — Discharge Instructions (Signed)
Use Tylenol for pain and fevers.  Up to 1000 mg per dose, up to 4 times per day.  Do not take more than 4000 mg of Tylenol/acetaminophen within 24 hours..  Use naproxen/Aleve for anti-inflammatory pain relief. Use up to 500mg every 12 hours. Do not take more frequently than this. Do not use other NSAIDs (ibuprofen, Advil) while taking this medication. It is safe to take Tylenol with this.   

## 2022-06-13 NOTE — ED Provider Notes (Signed)
[facility] Provider Note    Event Date/Time   First MD Initiated Contact with Patient 06/13/22 0007     (approximate)   History   Chest Pain   HPI  Teresa Manning is a 57 y.o. female who presents to the ED for evaluation of Chest Pain   Patient presents to the ED for evaluation of bilateral chest discomfort over the past 3 days.  She reports she developed cough and "sniffles" about 4 days ago and has been coughing frequently.  Reports losing her sense of taste and smell.  Reports congestion and poor appetite.  Denies abdominal pain or emesis.  Denies productive cough and reports the cough seems better now, but the pain to her bilateral chest wall lingers.   Physical Exam   Triage Vital Signs: ED Triage Vitals  Enc Vitals Group     BP 06/12/22 2359 (!) 130/90     Pulse Rate 06/12/22 2359 78     Resp 06/12/22 2359 20     Temp 06/12/22 2359 98.6 F (37 C)     Temp Source 06/12/22 2359 Oral     SpO2 06/12/22 2359 100 %     Weight 06/12/22 2356 195 lb (88.5 kg)     Height 06/12/22 2356 5\' 9"  (1.753 m)     Head Circumference --      Peak Flow --      Pain Score 06/12/22 2356 9     Pain Loc --      Pain Edu? --      Excl. in [place]? --     Most recent vital signs: Vitals:   06/12/22 2359  BP: (!) 130/90  Pulse: 78  Resp: 20  Temp: 98.6 F (37 C)  SpO2: 100%    General: Awake, no distress.  CV:  Good peripheral perfusion.  Resp:  Normal effort.  No wheezing, tachypnea or distress Abd:  No distention.  MSK:  No deformity noted.  Chest pain is reproducible without overlying skin changes or signs of trauma. Neuro:  No focal deficits appreciated. Other:     ED Results / Procedures / Treatments   Labs (all labs ordered are listed, but only abnormal results are displayed) Labs Reviewed  BASIC METABOLIC PANEL - Abnormal; Notable for the following components:      Result Value   Calcium 8.1 (*)    All other components within normal limits   SARS CORONAVIRUS 2 BY RT PCR  CBC  POC URINE PREG, ED  TROPONIN I (HIGH SENSITIVITY)  TROPONIN I (HIGH SENSITIVITY)    EKG Sinus rhythm with sinus arrhythmia, rate of 76 bpm.  Normal axis and intervals.  No clear signs of acute ischemia.  RADIOLOGY CXR interpreted by me without evidence of acute cardiopulmonary pathology.=  Official radiology report(s): DG Chest 2 View  Result Date: 06/13/2022 CLINICAL DATA:  Chest pain EXAM: CHEST - 2 VIEW COMPARISON:  05/14/2022 FINDINGS: Cardiac shadow is within normal limits. The lungs are well aerated bilaterally. Mild interstitial prominence is again noted without focal confluent infiltrate. No sizable effusion is seen. No bony abnormality is noted. IMPRESSION: No acute abnormality is identified. Electronically Signed   By: Inez Catalina M.D.   On: 06/13/2022 00:15    PROCEDURES and INTERVENTIONS:  .1-3 Lead EKG Interpretation  Performed by: Vladimir Crofts, MD Authorized by: Vladimir Crofts, MD     Interpretation: normal     ECG rate:  74   ECG rate assessment: normal  Rhythm: sinus rhythm     Ectopy: none     Conduction: normal     Medications  ketorolac (TORADOL) 30 MG/ML injection 30 mg (30 mg Intramuscular Given 06/13/22 0136)     IMPRESSION / MDM / ASSESSMENT AND PLAN / ED COURSE  I reviewed the triage vital signs and the nursing notes.  Differential diagnosis includes, but is not limited to, ACS, PTX, PNA, muscle strain/spasm, PE, dissection  {Patient presents with symptoms of an acute illness or injury that is potentially life-threatening.  57 year old woman presents to the ED with chest discomfort that I suspect is more likely MSK in etiology from her cough from a likely viral URI in the past few days, ultimately suitable for outpatient management.  Looks systemically well.  Reassuring workup with nonischemic EKG, 2 negative troponins.  Normal metabolic panel and CBC as well as a clear CXR.  Suspect a viral etiology causing a  URI this week and I suspect the discomfort she is feeling is from a cough related to this.  While I considered observation admission for this patient, I believe a trial of outpatient management is reasonable.  We discussed return precautions.  Clinical Course as of 06/13/22 0434  Sat Jun 13, 2022  0425 Reassessed.  Feeling much better and is appreciative.  We discussed reassuring cardiac workup and possible etiologies of her symptoms. [DS]    Clinical Course User Index [DS] Vladimir Crofts, MD     FINAL CLINICAL IMPRESSION(S) / ED DIAGNOSES   Final diagnoses:  Other chest pain  Acute cough  Chest wall pain     Rx / DC Orders   ED Discharge Orders     None        Note:  This document was prepared using Dragon voice recognition software and may include unintentional dictation errors.   Vladimir Crofts, MD 06/13/22 [phone]

## 2022-09-08 ENCOUNTER — Encounter: Payer: Self-pay | Admitting: Emergency Medicine

## 2022-09-08 ENCOUNTER — Emergency Department: Payer: Medicaid Other

## 2022-09-08 ENCOUNTER — Emergency Department
Admission: EM | Admit: 2022-09-08 | Discharge: 2022-09-08 | Disposition: A | Discharge status: Home / Self Care | Payer: Medicaid Other | Attending: Emergency Medicine | Admitting: Emergency Medicine

## 2022-09-08 ENCOUNTER — Other Ambulatory Visit: Payer: Self-pay

## 2022-09-08 DIAGNOSIS — R1030 Lower abdominal pain, unspecified: Secondary | ICD-10-CM | POA: Insufficient documentation

## 2022-09-08 LAB — LIPASE, BLOOD: Lipase: 37 U/L (ref 11–51)

## 2022-09-08 LAB — COMPREHENSIVE METABOLIC PANEL
ALT: 15 U/L (ref 0–44)
AST: 18 U/L (ref 15–41)
Albumin: 3.1 g/dL — ABNORMAL LOW (ref 3.5–5.0)
Alkaline Phosphatase: 73 U/L (ref 38–126)
Anion gap: 6 (ref 5–15)
BUN: 12 mg/dL (ref 6–20)
CO2: 23 mmol/L (ref 22–32)
Calcium: 8.6 mg/dL — ABNORMAL LOW (ref 8.9–10.3)
Chloride: 108 mmol/L (ref 98–111)
Creatinine, Ser: 0.74 mg/dL (ref 0.44–1.00)
GFR, Estimated: >60 mL/min (ref >60)
Glucose, Bld: 150 mg/dL — ABNORMAL HIGH (ref 70–99)
Potassium: 4.1 mmol/L (ref 3.5–5.1)
Sodium: 137 mmol/L (ref 135–145)
Total Bilirubin: 0.4 mg/dL (ref 0.3–1.2)
Total Protein: 6.3 g/dL — ABNORMAL LOW (ref 6.5–8.1)

## 2022-09-08 LAB — URINALYSIS, ROUTINE W REFLEX MICROSCOPIC
Bilirubin Urine: NEGATIVE
Glucose, UA: NEGATIVE mg/dL
Hgb urine dipstick: NEGATIVE
Ketones, ur: NEGATIVE mg/dL
Leukocytes,Ua: NEGATIVE
Nitrite: NEGATIVE
Protein, ur: NEGATIVE mg/dL
Specific Gravity, Urine: 1.018 (ref 1.005–1.030)
pH: 7 (ref 5.0–8.0)

## 2022-09-08 LAB — CBC WITH DIFFERENTIAL/PLATELET
Abs Immature Granulocytes: 0.01 10*3/uL (ref 0.00–0.07)
Basophils Absolute: 0 10*3/uL (ref 0.0–0.1)
Basophils Relative: 0 %
Eosinophils Absolute: 0.1 10*3/uL (ref 0.0–0.5)
Eosinophils Relative: 2 %
HCT: 40.2 % (ref 36.0–46.0)
Hemoglobin: 13.5 g/dL (ref 12.0–15.0)
Immature Granulocytes: 0 %
Lymphocytes Relative: 32 %
Lymphs Abs: 1.8 10*3/uL (ref 0.7–4.0)
MCH: 29.7 pg (ref 26.0–34.0)
MCHC: 33.6 g/dL (ref 30.0–36.0)
MCV: 88.5 fL (ref 80.0–100.0)
Monocytes Absolute: 0.4 10*3/uL (ref 0.1–1.0)
Monocytes Relative: 7 %
Neutro Abs: 3.2 10*3/uL (ref 1.7–7.7)
Neutrophils Relative %: 59 %
Platelets: 271 10*3/uL (ref 150–400)
RBC: 4.54 MIL/uL (ref 3.87–5.11)
RDW: 13.5 % (ref 11.5–15.5)
WBC: 5.5 10*3/uL (ref 4.0–10.5)
nRBC: 0 % (ref 0.0–0.2)

## 2022-09-08 MED ORDER — MORPHINE SULFATE (PF) 4 MG/ML IV SOLN
4.0000 mg | Freq: Once | INTRAVENOUS | Status: AC
  Administered 2022-09-08: 4 mg via INTRAVENOUS
  Filled 2022-09-08: qty 1

## 2022-09-08 MED ORDER — ONDANSETRON HCL 4 MG/2ML IJ SOLN
4.0000 mg | Freq: Once | INTRAMUSCULAR | Status: AC
  Administered 2022-09-08: 4 mg via INTRAVENOUS
  Filled 2022-09-08: qty 2

## 2022-09-08 MED ORDER — DICYCLOMINE HCL 10 MG PO CAPS: 10.0000 mg | ORAL_CAPSULE | Freq: Three times a day (TID) | ORAL | 0 refills | Status: AC

## 2022-09-08 MED ORDER — KETOROLAC TROMETHAMINE 30 MG/ML IJ SOLN
15.0000 mg | Freq: Once | INTRAMUSCULAR | Status: AC
  Administered 2022-09-08: 15 mg via INTRAVENOUS
  Filled 2022-09-08: qty 1

## 2022-09-08 MED ORDER — IOHEXOL 300 MG/ML  SOLN
100.0000 mL | Freq: Once | INTRAMUSCULAR | Status: AC | PRN
  Administered 2022-09-08: 100 mL via INTRAVENOUS

## 2022-09-08 MED ORDER — DICYCLOMINE HCL 10 MG PO CAPS
10.0000 mg | ORAL_CAPSULE | Freq: Once | ORAL | Status: AC
  Administered 2022-09-08: 10 mg via ORAL
  Filled 2022-09-08: qty 1

## 2022-09-08 NOTE — ED Triage Notes (Signed)
Patient ambulatory to triage with steady gait, without difficulty or distress noted; pt reports lower abd pain since Sunday; denies any accomp symptoms

## 2022-09-08 NOTE — Discharge Instructions (Addendum)
Your blood work urine sample and CAT scan were all reassuring.  We are seeing a lot of stool in your colon which could indicate constipation.  You can try the Bentyl as needed for pain.  Take it if it helps but stop taking it if it is not providing any relief.

## 2022-09-08 NOTE — ED Provider Notes (Signed)
I was asked to follow-up on patient's CAT scan results which does not show any acute abnormality.  Does show redundant colon with large stool burden.  Discussed results with the patient.  She is resting comfortably but does still have some ongoing lower abdominal pain.  Will give a dose of Bentyl.  Discussed likely constipation recommended MiraLAX.  Patient appropriate for discharge at this time.   Georga Hacking, MD 09/08/22 [phone]

## 2022-09-08 NOTE — ED Notes (Signed)
Pt to desk inquiring about wait time. Pt updated on wait time.

## 2022-09-08 NOTE — ED Notes (Signed)
ED Provider at bedside. 

## 2022-09-08 NOTE — ED Notes (Signed)
EDP at bedside  

## 2022-09-08 NOTE — ED Provider Notes (Signed)
[facility] Provider Note    None    (approximate)   History   Abdominal Pain   HPI  Teresa Manning is a 57 y.o. female  here with abdominal pain. Pt reports that over the past 2-3 days she has had persistent, positional but fairly constant lower abdominal pain. Feels like she has had pressure in her lower abdomen as well. Denies any urinary symptoms. No fevers, chills. No vomiting, no urinary sx.       Physical Exam   Triage Vital Signs: ED Triage Vitals  Enc Vitals Group     BP 09/08/22 0141 123/76     Pulse Rate 09/08/22 0141 79     Resp 09/08/22 0141 18     Temp 09/08/22 0141 98.5 F (36.9 C)     Temp Source 09/08/22 0141 Oral     SpO2 09/08/22 0141 96 %     Weight 09/08/22 0137 200 lb (90.7 kg)     Height 09/08/22 0137 5\' 9"  (1.753 m)     Head Circumference --      Peak Flow --      Pain Score 09/08/22 0137 9     Pain Loc --      Pain Edu? --      Excl. in [place]? --     Most recent vital signs: Vitals:   09/08/22 0553 09/08/22 0620  BP: 120/70 (!) 151/85  Pulse: 69 62  Resp: 16 18  Temp:    SpO2: 99% 98%     General: Awake, no distress.  CV:  Good peripheral perfusion.  Resp:  Normal work of breathing.  Abd:  No distention. Minimal TTP lower abdomen. No rebound or guarding.  Other:  MMM. Non-toxic.   ED Results / Procedures / Treatments   Labs (all labs ordered are listed, but only abnormal results are displayed) Labs Reviewed  COMPREHENSIVE METABOLIC PANEL - Abnormal; Notable for the following components:      Result Value   Glucose, Bld 150 (*)    Calcium 8.6 (*)    Total Protein 6.3 (*)    Albumin 3.1 (*)    All other components within normal limits  URINALYSIS, ROUTINE W REFLEX MICROSCOPIC - Abnormal; Notable for the following components:   Color, Urine YELLOW (*)    APPearance CLOUDY (*)    All other components within normal limits  CBC WITH DIFFERENTIAL/PLATELET  LIPASE, BLOOD      EKG    RADIOLOGY CT A/P: Pending   I also independently reviewed and agree with radiologist interpretations.   PROCEDURES:  Critical Care performed: No   MEDICATIONS ORDERED IN ED: Medications  ketorolac (TORADOL) 30 MG/ML injection 15 mg (15 mg Intravenous Given 09/08/22 0619)  morphine (PF) 4 MG/ML injection 4 mg (4 mg Intravenous Given 09/08/22 0619)  ondansetron (ZOFRAN) injection 4 mg (4 mg Intravenous Given 09/08/22 0619)     IMPRESSION / MDM / ASSESSMENT AND PLAN / ED COURSE  I reviewed the triage vital signs and the nursing notes.                              Differential diagnosis includes, but is not limited to, abdominal wall pain, UTI, diverticulitis, colitis, proctitis, bladder stone, prolapse.  Patient's presentation is most consistent with acute presentation with potential threat to life or bodily function.  The patient is on the cardiac monitor to evaluate for evidence of  arrhythmia and/or significant heart rate changes  57 yo F here with somewhat atypical lower abdominal pain. Query possible MSK pain though mild tenderness noted diffusely on exam. CT scan is pending. Lab work overall reassuring. CBC without leukocytosis. CMP unremarkable with normal renal function and LFTs. UA negative. Denies any vaginal bleeding. She is s/p hysterectomy. No rash or skin changes. Plan to f/u CT and dispo accordingly.    FINAL CLINICAL IMPRESSION(S) / ED DIAGNOSES   Final diagnoses:  Lower abdominal pain     Rx / DC Orders   ED Discharge Orders     None        Note:  This document was prepared using Dragon voice recognition software and may include unintentional dictation errors.   Shaune Pollack, MD 09/08/22 [phone]

## 2022-09-08 NOTE — ED Notes (Signed)
Pt sleeping, in no acute distress, respirations unlabored.

## 2023-01-29 ENCOUNTER — Other Ambulatory Visit: Payer: 59

## 2023-03-29 ENCOUNTER — Encounter: Payer: Self-pay | Admitting: Internal Medicine

## 2023-03-29 ENCOUNTER — Ambulatory Visit (INDEPENDENT_AMBULATORY_CARE_PROVIDER_SITE_OTHER): Payer: 59 | Admitting: Internal Medicine

## 2023-03-29 VITALS — BP 110/78 | HR 65 | Ht 69.0 in | Wt 209.0 lb

## 2023-03-29 DIAGNOSIS — Z1231 Encounter for screening mammogram for malignant neoplasm of breast: Secondary | ICD-10-CM

## 2023-03-29 DIAGNOSIS — E559 Vitamin D deficiency, unspecified: Secondary | ICD-10-CM | POA: Diagnosis not present

## 2023-03-29 DIAGNOSIS — R300 Dysuria: Secondary | ICD-10-CM

## 2023-03-29 DIAGNOSIS — Z0000 Encounter for general adult medical examination without abnormal findings: Secondary | ICD-10-CM | POA: Diagnosis not present

## 2023-03-29 DIAGNOSIS — E782 Mixed hyperlipidemia: Secondary | ICD-10-CM | POA: Diagnosis not present

## 2023-03-29 DIAGNOSIS — Z720 Tobacco use: Secondary | ICD-10-CM

## 2023-03-29 DIAGNOSIS — Z1151 Encounter for screening for human papillomavirus (HPV): Secondary | ICD-10-CM

## 2023-03-29 DIAGNOSIS — R7303 Prediabetes: Secondary | ICD-10-CM | POA: Diagnosis not present

## 2023-03-29 DIAGNOSIS — Z1211 Encounter for screening for malignant neoplasm of colon: Secondary | ICD-10-CM

## 2023-03-29 DIAGNOSIS — Z1389 Encounter for screening for other disorder: Secondary | ICD-10-CM

## 2023-03-29 DIAGNOSIS — Z0130 Encounter for examination of blood pressure without abnormal findings: Secondary | ICD-10-CM

## 2023-03-29 LAB — POCT URINALYSIS DIPSTICK
Bilirubin, UA: NEGATIVE
Blood, UA: NEGATIVE
Glucose, UA: NEGATIVE
Ketones, UA: NEGATIVE
Nitrite, UA: NEGATIVE
Protein, UA: NEGATIVE
Spec Grav, UA: 1.015 (ref 1.010–1.025)
Urobilinogen, UA: 0.2 U/dL
pH, UA: 6.5 (ref 5.0–8.0)

## 2023-03-29 MED ORDER — CIPROFLOXACIN HCL 500 MG PO TABS: 500.0000 mg | ORAL_TABLET | Freq: Two times a day (BID) | ORAL | 0 refills | Status: AC

## 2023-03-29 NOTE — Progress Notes (Addendum)
 Patient comes in  Established Patient Office Visit  Subjective:  Patient ID: Teresa Manning, female    DOB: 22-Sep-1965  Age: 58 y.o. MRN: [id number]  Chief Complaint  Patient presents with   Annual Exam    CPE, PAP    Patient comes in for a CPE.  She has not been in the office since November 2023.  Generally feels well and is fasting for labs.  She is due for mammogram, colon cancer screening as well.  She continues to smoke cigarettes.  Will also schedule low-dose CT chest.    No other concerns at this time.   Past Medical History:  Diagnosis Date   Hypertension    pt statets pcp put her on bp meds but she never took bc she does not have htn    Past Surgical History:  Procedure Laterality Date   ACHILLES TENDON SURGERY Right 01/17/2021   Procedure: ACHILLES TENDON REPAIR;  Surgeon: Ashley Soulier, DPM;  Location: [facility];  Service: Podiatry;  Laterality: Right;   COLPOSCOPY     CYSTOSCOPY N/A 11/01/2018   Procedure: CYSTOSCOPY;  Surgeon: Arloa Lamar SQUIBB, MD;  Location: [facility];  Service: Gynecology;  Laterality: N/A;   OSTECTOMY Right 01/17/2021   Procedure: CALCANEAL EXOSTECTOMY NAD RIGHT THIRD TOE FLEXOR TENOTOMY;  Surgeon: Ashley Soulier, DPM;  Location: [facility];  Service: Podiatry;  Laterality: Right;   TOTAL LAPAROSCOPIC HYSTERECTOMY WITH BILATERAL SALPINGO OOPHORECTOMY N/A 11/01/2018   Procedure: TOTAL LAPAROSCOPIC HYSTERECTOMY WITH BILATERAL SALPINGO OOPHORECTOMY;  Surgeon: Arloa Lamar SQUIBB, MD;  Location: [facility];  Service: Gynecology;  Laterality: N/A;    Social History   Socioeconomic History   Marital status: Married    Spouse name: Not on file   Number of children: Not on file   Years of education: Not on file   Highest education level: Not on file  Occupational History   Not on file  Tobacco Use   Smoking status: Every Day    Current packs/day: 1.00    Average packs/day: 1 pack/day for 30.0 years (30.0 ttl pk-yrs)    Types: Cigarettes   Smokeless  tobacco: Never  Vaping Use   Vaping status: Never Used  Substance and Sexual Activity   Alcohol use: No   Drug use: No   Sexual activity: Yes    Birth control/protection: None  Other Topics Concern   Not on file  Social History Narrative   Not on file   Social Drivers of Health   Financial Resource Strain: Not on file  Food Insecurity: Not on file  Transportation Needs: Not on file  Physical Activity: Not on file  Stress: Not on file  Social Connections: Not on file  Intimate Partner Violence: Not on file    Family History  Problem Relation Age of Onset   Diabetes Mother    Hypertension Mother    Hyperlipidemia Mother     No Known Allergies  Outpatient Medications Prior to Visit  Medication Sig   dicyclomine  (BENTYL ) 10 MG capsule Take 1 capsule (10 mg total) by mouth 4 (four) times daily -  before meals and at bedtime for 10 days. (Patient not taking: Reported on 03/29/2023)   oseltamivir  (TAMIFLU ) 75 MG capsule Take 1 capsule (75 mg total) by mouth 2 (two) times daily. (Patient not taking: Reported on 03/29/2023)   oxyCODONE -acetaminophen  (PERCOCET) 5-325 MG tablet Take 1-2 tablets by mouth every 6 (six) hours as needed for severe pain. Max 6 tabs per day (Patient not taking:  Reported on 03/29/2023)   No facility-administered medications prior to visit.    Review of Systems  Constitutional: Negative.  Negative for chills, fever, malaise/fatigue and weight loss.  HENT:  Negative for congestion, ear pain, hearing loss, nosebleeds, sinus pain and tinnitus.   Eyes: Negative.   Respiratory: Negative.  Negative for cough and shortness of breath.   Cardiovascular: Negative.  Negative for chest pain, palpitations and leg swelling.  Gastrointestinal: Negative.  Negative for abdominal pain, constipation, diarrhea, heartburn, nausea and vomiting.  Genitourinary: Negative.  Negative for dysuria and flank pain.  Musculoskeletal: Negative.  Negative for joint pain and myalgias.   Skin: Negative.   Neurological:  Negative for dizziness, tingling, tremors, sensory change, speech change, focal weakness and headaches.  Endo/Heme/Allergies: Negative.   Psychiatric/Behavioral: Negative.  Negative for depression and suicidal ideas. The patient is not nervous/anxious.        Objective:   BP 110/78   Pulse 65   Ht 5' 9 (1.753 m)   Wt 209 lb (94.8 kg)   LMP 06/17/2016 (Exact Date)   SpO2 96%   BMI 30.86 kg/m   Vitals:   03/29/23 0940  BP: 110/78  Pulse: 65  Height: 5' 9 (1.753 m)  Weight: 209 lb (94.8 kg)  SpO2: 96%  BMI (Calculated): 30.85    Physical Exam Vitals and nursing note reviewed. Exam conducted with a chaperone present.  Constitutional:      Appearance: Normal appearance.  HENT:     Head: Normocephalic and atraumatic.     Nose: Nose normal.     Mouth/Throat:     Mouth: Mucous membranes are moist.     Pharynx: Oropharynx is clear.  Eyes:     Conjunctiva/sclera: Conjunctivae normal.     Pupils: Pupils are equal, round, and reactive to light.  Cardiovascular:     Rate and Rhythm: Normal rate and regular rhythm.     Pulses: Normal pulses.     Heart sounds: Normal heart sounds. No murmur heard. Pulmonary:     Effort: Pulmonary effort is normal.     Breath sounds: Normal breath sounds. No wheezing.  Chest:  Breasts:    Right: Normal. No swelling, bleeding, inverted nipple, mass, nipple discharge, skin change or tenderness.     Left: Normal. No swelling, bleeding, inverted nipple, mass, nipple discharge, skin change or tenderness.  Abdominal:     General: Bowel sounds are normal.     Palpations: Abdomen is soft. There is no mass.     Tenderness: There is no abdominal tenderness. There is no right CVA tenderness or left CVA tenderness.     Hernia: There is no hernia in the left inguinal area or right inguinal area.  Genitourinary:    General: Normal vulva.     Pubic Area: No rash or pubic lice.      Labia:        Right: No rash,  tenderness, lesion or injury.        Left: No rash, tenderness, lesion or injury.      Urethra: No prolapse.     Vagina: Normal. No signs of injury and foreign body. No vaginal discharge, erythema, tenderness, bleeding, lesions or prolapsed vaginal walls.     Cervix: Normal.     Uterus: Normal.      Adnexa: Right adnexa normal and left adnexa normal.       Right: No mass, tenderness or fullness.         Left: No mass, tenderness  or fullness.    Musculoskeletal:        General: Normal range of motion.     Cervical back: Normal range of motion.     Right lower leg: No edema.     Left lower leg: No edema.  Lymphadenopathy:     Upper Body:     Right upper body: No supraclavicular, axillary or pectoral adenopathy.     Left upper body: No supraclavicular, axillary or pectoral adenopathy.     Lower Body: No right inguinal adenopathy. No left inguinal adenopathy.  Skin:    General: Skin is warm and dry.  Neurological:     General: No focal deficit present.     Mental Status: She is alert and oriented to person, place, and time.  Psychiatric:        Mood and Affect: Mood normal.        Behavior: Behavior normal.      Results for orders placed or performed in visit on 03/29/23  POCT Urinalysis Dipstick (81002)  Result Value Ref Range   Color, UA yellow    Clarity, UA clear    Glucose, UA Negative Negative   Bilirubin, UA neg    Ketones, UA neg    Spec Grav, UA 1.015 1.010 - 1.025   Blood, UA neg    pH, UA 6.5 5.0 - 8.0   Protein, UA Negative Negative   Urobilinogen, UA 0.2 0.2 or 1.0 E.U./dL   Nitrite, UA neg    Leukocytes, UA Small (1+) (A) Negative   Appearance clear    Odor no     Recent Results (from the past 2160 hours)  POCT Urinalysis Dipstick (18997)     Status: Abnormal   Collection Time: 03/29/23  9:51 AM  Result Value Ref Range   Color, UA yellow    Clarity, UA clear    Glucose, UA Negative Negative   Bilirubin, UA neg    Ketones, UA neg    Spec Grav, UA  1.015 1.010 - 1.025   Blood, UA neg    pH, UA 6.5 5.0 - 8.0   Protein, UA Negative Negative   Urobilinogen, UA 0.2 0.2 or 1.0 E.U./dL   Nitrite, UA neg    Leukocytes, UA Small (1+) (A) Negative   Appearance clear    Odor no       Assessment & Plan:  Labs.  Schedule mammogram, Cologuard and low-dose CT chest. She had complained of dysuria and her urine dipstick shows small pus cells.  Will start on p.o. Cipro  and send for urine culture. Problem List Items Addressed This Visit   None Visit Diagnoses       Health maintenance examination    -  Primary     Screening for blood or protein in urine       Relevant Orders   POCT Urinalysis Dipstick (81002) (Completed)     Breast cancer screening by mammogram       Relevant Orders   MM 3D SCREENING MAMMOGRAM BILATERAL BREAST     Mixed hyperlipidemia       Relevant Orders   CMP14+EGFR   Lipid Panel w/o Chol/HDL Ratio     Prediabetes       Relevant Orders   CBC with Diff   Hemoglobin A1c     Vitamin D deficiency         Nicotine abuse       Relevant Orders   CBC with Diff   CT CHEST LUNG  CANCER SCREENING LOW DOSE WO CONTRAST     Colon cancer screening       Relevant Orders   Cologuard     Screening for human papillomavirus (HPV)       Relevant Orders   IGP,CtNgTv,Apt HPV,rfx16/18,45       Return in about 2 weeks (around 04/12/2023).   Total time spent: 30 minutes  FERNAND FREDY RAMAN, MD  03/29/2023   This document may have been prepared by [facility] Voice Recognition software and as such may include unintentional dictation errors.

## 2023-03-30 LAB — URINALYSIS
Bilirubin, UA: NEGATIVE
Glucose, UA: NEGATIVE
Ketones, UA: NEGATIVE
Nitrite, UA: NEGATIVE
Protein,UA: NEGATIVE
RBC, UA: NEGATIVE
Specific Gravity, UA: 1.012 (ref 1.005–1.030)
Urobilinogen, Ur: 0.2 mg/dL (ref 0.2–1.0)
pH, UA: 6.5 (ref 5.0–7.5)

## 2023-03-30 LAB — CMP14+EGFR
ALT: 14 [IU]/L (ref 0–32)
AST: 18 [IU]/L (ref 0–40)
Albumin: 3.5 g/dL — ABNORMAL LOW (ref 3.8–4.9)
Alkaline Phosphatase: 91 [IU]/L (ref 44–121)
BUN/Creatinine Ratio: 10 (ref 9–23)
BUN: 8 mg/dL (ref 6–24)
Bilirubin Total: 0.3 mg/dL (ref 0.0–1.2)
CO2: 26 mmol/L (ref 20–29)
Calcium: 9.1 mg/dL (ref 8.7–10.2)
Chloride: 106 mmol/L (ref 96–106)
Creatinine, Ser: 0.79 mg/dL (ref 0.57–1.00)
Globulin, Total: 2.4 g/dL (ref 1.5–4.5)
Glucose: 78 mg/dL (ref 70–99)
Potassium: 4.4 mmol/L (ref 3.5–5.2)
Sodium: 144 mmol/L (ref 134–144)
Total Protein: 5.9 g/dL — ABNORMAL LOW (ref 6.0–8.5)
eGFR: 87 mL/min/{1.73_m2} (ref >59)

## 2023-03-30 LAB — CBC WITH DIFFERENTIAL/PLATELET
Basophils Absolute: 0 10*3/uL (ref 0.0–0.2)
Basos: 1 %
EOS (ABSOLUTE): 0.1 10*3/uL (ref 0.0–0.4)
Eos: 3 %
Hematocrit: 44.6 % (ref 34.0–46.6)
Hemoglobin: 14.7 g/dL (ref 11.1–15.9)
Immature Grans (Abs): 0 10*3/uL (ref 0.0–0.1)
Immature Granulocytes: 0 %
Lymphocytes Absolute: 1.8 10*3/uL (ref 0.7–3.1)
Lymphs: 44 %
MCH: 30 pg (ref 26.6–33.0)
MCHC: 33 g/dL (ref 31.5–35.7)
MCV: 91 fL (ref 79–97)
Monocytes Absolute: 0.4 10*3/uL (ref 0.1–0.9)
Monocytes: 10 %
Neutrophils Absolute: 1.6 10*3/uL (ref 1.4–7.0)
Neutrophils: 42 %
Platelets: 263 10*3/uL (ref 150–450)
RBC: 4.9 x10E6/uL (ref 3.77–5.28)
RDW: 12.5 % (ref 11.7–15.4)
WBC: 3.9 10*3/uL (ref 3.4–10.8)

## 2023-03-30 LAB — LIPID PANEL W/O CHOL/HDL RATIO
Cholesterol, Total: 181 mg/dL (ref 100–199)
HDL: 51 mg/dL (ref >39)
LDL Chol Calc (NIH): 116 mg/dL — ABNORMAL HIGH (ref 0–99)
Triglycerides: 75 mg/dL (ref 0–149)
VLDL Cholesterol Cal: 14 mg/dL (ref 5–40)

## 2023-03-30 LAB — HEMOGLOBIN A1C
Est. average glucose Bld gHb Est-mCnc: 131 mg/dL
Hgb A1c MFr Bld: 6.2 % — ABNORMAL HIGH (ref 4.8–5.6)

## 2023-03-31 LAB — IGP,CTNGTV,APT HPV,RFX16/18,45
Chlamydia, Nuc. Acid Amp: NEGATIVE
Gonococcus, Nuc. Acid Amp: NEGATIVE
HPV Aptima: NEGATIVE
PAP Smear Comment: 0
Trich vag by NAA: NEGATIVE

## 2023-04-01 ENCOUNTER — Telehealth: Payer: Self-pay | Admitting: Internal Medicine

## 2023-04-01 LAB — URINE CULTURE

## 2023-04-01 NOTE — Telephone Encounter (Signed)
Patient left VM wanting her lab results. See lab results in in-basket for results.

## 2023-04-06 NOTE — Progress Notes (Signed)
Patient notified

## 2023-04-12 ENCOUNTER — Ambulatory Visit (INDEPENDENT_AMBULATORY_CARE_PROVIDER_SITE_OTHER): Payer: Medicaid Other | Admitting: Internal Medicine

## 2023-04-12 ENCOUNTER — Encounter: Payer: Self-pay | Admitting: Internal Medicine

## 2023-04-12 VITALS — BP 130/84 | HR 88 | Ht 69.0 in | Wt 210.2 lb

## 2023-04-12 DIAGNOSIS — Z720 Tobacco use: Secondary | ICD-10-CM | POA: Diagnosis not present

## 2023-04-12 DIAGNOSIS — I10 Essential (primary) hypertension: Secondary | ICD-10-CM | POA: Diagnosis not present

## 2023-04-12 DIAGNOSIS — R7303 Prediabetes: Secondary | ICD-10-CM | POA: Diagnosis not present

## 2023-04-12 DIAGNOSIS — E782 Mixed hyperlipidemia: Secondary | ICD-10-CM

## 2023-04-12 NOTE — Progress Notes (Signed)
Established Patient Office Visit  Subjective:  Patient ID: Teresa Manning, female    DOB: 1965/09/28  Age: 58 y.o. MRN: [id number]  Chief Complaint  Patient presents with   Follow-up    2 week follow up    Patient comes in for follow-up today.  Her blood pressure reading is high, repeat is still elevated.  Although she is feeling well and has no new complaints.  Her labs were discussed.  Her hemoglobin A1c is 6.2 which is a prediabetic level.  Patient advised to control her diet and increasing physical activity.  She still waiting to get her mammogram and her CT of the chest.    No other concerns at this time.   Past Medical History:  Diagnosis Date   Hypertension    pt statets pcp put her on bp meds but she never took bc she does not have htn    Past Surgical History:  Procedure Laterality Date   ACHILLES TENDON SURGERY Right 01/17/2021   Procedure: ACHILLES TENDON REPAIR;  Surgeon: Gwyneth Revels, DPM;  Location: [facility];  Service: Podiatry;  Laterality: Right;   COLPOSCOPY     CYSTOSCOPY N/A 11/01/2018   Procedure: CYSTOSCOPY;  Surgeon: Nadara Mustard, MD;  Location: [facility];  Service: Gynecology;  Laterality: N/A;   OSTECTOMY Right 01/17/2021   Procedure: CALCANEAL EXOSTECTOMY NAD RIGHT THIRD TOE FLEXOR TENOTOMY;  Surgeon: Gwyneth Revels, DPM;  Location: [facility];  Service: Podiatry;  Laterality: Right;   TOTAL LAPAROSCOPIC HYSTERECTOMY WITH BILATERAL SALPINGO OOPHORECTOMY N/A 11/01/2018   Procedure: TOTAL LAPAROSCOPIC HYSTERECTOMY WITH BILATERAL SALPINGO OOPHORECTOMY;  Surgeon: Nadara Mustard, MD;  Location: [facility];  Service: Gynecology;  Laterality: N/A;    Social History   Socioeconomic History   Marital status: Married    Spouse name: Not on file   Number of children: Not on file   Years of education: Not on file   Highest education level: Not on file  Occupational History   Not on file  Tobacco Use   Smoking status: Every Day    Current packs/day:  1.00    Average packs/day: 1 pack/day for 30.0 years (30.0 ttl pk-yrs)    Types: Cigarettes   Smokeless tobacco: Never  Vaping Use   Vaping status: Never Used  Substance and Sexual Activity   Alcohol use: No   Drug use: No   Sexual activity: Yes    Birth control/protection: None  Other Topics Concern   Not on file  Social History Narrative   Not on file   Social Drivers of Health   Financial Resource Strain: Not on file  Food Insecurity: Not on file  Transportation Needs: Not on file  Physical Activity: Not on file  Stress: Not on file  Social Connections: Not on file  Intimate Partner Violence: Not on file    Family History  Problem Relation Age of Onset   Diabetes Mother    Hypertension Mother    Hyperlipidemia Mother     No Known Allergies  Outpatient Medications Prior to Visit  Medication Sig   dicyclomine (BENTYL) 10 MG capsule Take 1 capsule (10 mg total) by mouth 4 (four) times daily -  before meals and at bedtime for 10 days. (Patient not taking: Reported on 03/29/2023)   oseltamivir (TAMIFLU) 75 MG capsule Take 1 capsule (75 mg total) by mouth 2 (two) times daily. (Patient not taking: Reported on 04/12/2023)   oxyCODONE-acetaminophen (PERCOCET) 5-325 MG tablet Take 1-2 tablets by mouth  every 6 (six) hours as needed for severe pain. Max 6 tabs per day (Patient not taking: Reported on 04/12/2023)   No facility-administered medications prior to visit.    Review of Systems  Constitutional: Negative.  Negative for chills, diaphoresis, fever, malaise/fatigue and weight loss.  HENT: Negative.  Negative for congestion, nosebleeds, sinus pain and sore throat.   Eyes: Negative.   Respiratory: Negative.  Negative for cough, shortness of breath and stridor.   Cardiovascular: Negative.  Negative for chest pain, palpitations and leg swelling.  Gastrointestinal: Negative.  Negative for abdominal pain, constipation, diarrhea, heartburn, nausea and vomiting.  Genitourinary:  Negative.  Negative for dysuria and flank pain.  Musculoskeletal: Negative.  Negative for joint pain and myalgias.  Skin: Negative.   Neurological: Negative.  Negative for dizziness, tingling, tremors, sensory change, speech change and headaches.  Endo/Heme/Allergies: Negative.   Psychiatric/Behavioral: Negative.  Negative for depression and suicidal ideas. The patient is not nervous/anxious.        Objective:   BP 130/84   Pulse 88   Ht 5\' 9"  (1.753 m)   Wt 210 lb 3.2 oz (95.3 kg)   LMP 06/17/2016 (Exact Date)   SpO2 95%   BMI 31.04 kg/m   Vitals:   04/12/23 0943 04/12/23 1002  BP: (!) 142/86 130/84  Pulse: 88   Height: 5\' 9"  (1.753 m)   Weight: 210 lb 3.2 oz (95.3 kg)   SpO2: 95%   BMI (Calculated): 31.03     Physical Exam Vitals and nursing note reviewed.  Constitutional:      Appearance: Normal appearance.  HENT:     Head: Normocephalic and atraumatic.     Nose: Nose normal.     Mouth/Throat:     Mouth: Mucous membranes are moist.     Pharynx: Oropharynx is clear.  Eyes:     Conjunctiva/sclera: Conjunctivae normal.     Pupils: Pupils are equal, round, and reactive to light.  Cardiovascular:     Rate and Rhythm: Normal rate and regular rhythm.     Pulses: Normal pulses.     Heart sounds: Normal heart sounds. No murmur heard. Pulmonary:     Effort: Pulmonary effort is normal.     Breath sounds: Normal breath sounds. No wheezing.  Abdominal:     General: Bowel sounds are normal.     Palpations: Abdomen is soft.     Tenderness: There is no abdominal tenderness. There is no right CVA tenderness or left CVA tenderness.  Musculoskeletal:        General: Normal range of motion.     Cervical back: Normal range of motion.     Right lower leg: No edema.     Left lower leg: No edema.  Skin:    General: Skin is warm and dry.  Neurological:     General: No focal deficit present.     Mental Status: She is alert and oriented to person, place, and time.   Psychiatric:        Mood and Affect: Mood normal.        Behavior: Behavior normal.      No results found for any visits on 04/12/23.  Recent Results (from the past 2160 hours)  IGP,CtNgTv,Apt HPV,rfx16/18,45     Status: None   Collection Time: 03/29/23 12:00 AM  Result Value Ref Range   DIAGNOSIS: Comment     Comment: NEGATIVE FOR INTRAEPITHELIAL LESION OR MALIGNANCY.   Specimen adequacy: Comment     Comment: Satisfactory  for evaluation. No endocervical cells are present. This is consistent with a history of hysterectomy.    Clinician Provided ICD10 Comment     Comment: Z11.51   Performed by: Comment     Comment: Darolyn Rua, Cytotechnologist (ASCP)   PAP Smear Comment .    Note: Comment     Comment: The Pap smear is a screening test designed to aid in the detection of premalignant and malignant conditions of the uterine cervix.  It is not a diagnostic procedure and should not be used as the sole means of detecting cervical cancer.  Both false-positive and false-negative reports do occur.    Test Methodology Comment     Comment: This liquid based ThinPrep(R) pap test was screened with the use of an image guided system.    HPV Aptima Negative Negative    Comment: This nucleic acid amplification test detects fourteen high-risk HPV types (16,18,31,33,35,39,45,51,52,56,58,59,66,68) without differentiation.    HPV Genotype Reflex Comment     Comment: Criteria not met, HPV Genotype not performed.   Chlamydia, Nuc. Acid Amp Negative Negative   Gonococcus, Nuc. Acid Amp Negative Negative   Trich vag by NAA Negative Negative  POCT Urinalysis Dipstick (13086)     Status: Abnormal   Collection Time: 03/29/23  9:51 AM  Result Value Ref Range   Color, UA yellow    Clarity, UA clear    Glucose, UA Negative Negative   Bilirubin, UA neg    Ketones, UA neg    Spec Grav, UA 1.015 1.010 - 1.025   Blood, UA neg    pH, UA 6.5 5.0 - 8.0   Protein, UA Negative Negative    Urobilinogen, UA 0.2 0.2 or 1.0 E.U./dL   Nitrite, UA neg    Leukocytes, UA Small (1+) (A) Negative   Appearance clear    Odor no   CBC with Diff     Status: None   Collection Time: 03/29/23 10:28 AM  Result Value Ref Range   WBC 3.9 3.4 - 10.8 x10E3/uL   RBC 4.90 3.77 - 5.28 x10E6/uL   Hemoglobin 14.7 11.1 - 15.9 g/dL   Hematocrit 57.8 46.9 - 46.6 %   MCV 91 79 - 97 fL   MCH 30.0 26.6 - 33.0 pg   MCHC 33.0 31.5 - 35.7 g/dL   RDW 62.9 52.8 - 41.3 %   Platelets 263 150 - 450 x10E3/uL   Neutrophils 42 Not Estab. %   Lymphs 44 Not Estab. %   Monocytes 10 Not Estab. %   Eos 3 Not Estab. %   Basos 1 Not Estab. %   Neutrophils Absolute 1.6 1.4 - 7.0 x10E3/uL   Lymphocytes Absolute 1.8 0.7 - 3.1 x10E3/uL   Monocytes Absolute 0.4 0.1 - 0.9 x10E3/uL   EOS (ABSOLUTE) 0.1 0.0 - 0.4 x10E3/uL   Basophils Absolute 0.0 0.0 - 0.2 x10E3/uL   Immature Granulocytes 0 Not Estab. %   Immature Grans (Abs) 0.0 0.0 - 0.1 x10E3/uL  CMP14+EGFR     Status: Abnormal   Collection Time: 03/29/23 10:28 AM  Result Value Ref Range   Glucose 78 70 - 99 mg/dL   BUN 8 6 - 24 mg/dL   Creatinine, Ser 2.44 0.57 - 1.00 mg/dL   eGFR 87 >01 UU/VOZ/3.66   BUN/Creatinine Ratio 10 9 - 23   Sodium 144 134 - 144 mmol/L   Potassium 4.4 3.5 - 5.2 mmol/L   Chloride 106 96 - 106 mmol/L   CO2 26 20 -  29 mmol/L   Calcium 9.1 8.7 - 10.2 mg/dL   Total Protein 5.9 (L) 6.0 - 8.5 g/dL   Albumin 3.5 (L) 3.8 - 4.9 g/dL   Globulin, Total 2.4 1.5 - 4.5 g/dL   Bilirubin Total 0.3 0.0 - 1.2 mg/dL   Alkaline Phosphatase 91 44 - 121 IU/L   AST 18 0 - 40 IU/L   ALT 14 0 - 32 IU/L  Lipid Panel w/o Chol/HDL Ratio     Status: Abnormal   Collection Time: 03/29/23 10:28 AM  Result Value Ref Range   Cholesterol, Total 181 100 - 199 mg/dL   Triglycerides 75 0 - 149 mg/dL   HDL 51 >86 mg/dL   VLDL Cholesterol Cal 14 5 - 40 mg/dL   LDL Chol Calc (NIH) 578 (H) 0 - 99 mg/dL  Hemoglobin I6N     Status: Abnormal   Collection Time:  03/29/23 10:28 AM  Result Value Ref Range   Hgb A1c MFr Bld 6.2 (H) 4.8 - 5.6 %    Comment:          Prediabetes: 5.7 - 6.4          Diabetes: >6.4          Glycemic control for adults with diabetes: <7.0    Est. average glucose Bld gHb Est-mCnc 131 mg/dL  Urine Culture     Status: Abnormal   Collection Time: 03/29/23  4:00 PM   Specimen: Urine   UR  Result Value Ref Range   Urine Culture, Routine Final report (A)    Organism ID, Bacteria Proteus mirabilis (A)     Comment: Cefazolin with an MIC <=16 predicts susceptibility to the oral agents cefaclor, cefdinir, cefpodoxime, cefprozil, cefuroxime, cephalexin, and loracarbef when used for therapy of uncomplicated urinary tract infections due to E. coli, Klebsiella pneumoniae, and Proteus mirabilis. Greater than 100,000 colony forming units per mL    Antimicrobial Susceptibility Comment     Comment:       ** S = Susceptible; I = Intermediate; R = Resistant **                    P = Positive; N = Negative             MICS are expressed in micrograms per mL    Antibiotic                 RSLT#1    RSLT#2    RSLT#3    RSLT#4 Amoxicillin/Clavulanic Acid    S Ampicillin                     R Cefazolin                      S Cefepime                       S Ceftriaxone                    S Cefuroxime                     S Ciprofloxacin                  S Ertapenem                      S Gentamicin  S Levofloxacin                   S Meropenem                      S Nitrofurantoin                 R Piperacillin/Tazobactam        S Tetracycline                   R Tobramycin                     S Trimethoprim/Sulfa             S   Urinalysis     Status: Abnormal   Collection Time: 03/29/23  4:30 PM  Result Value Ref Range   Specific Gravity, UA 1.012 1.005 - 1.030   pH, UA 6.5 5.0 - 7.5   Color, UA Yellow Yellow   Appearance Ur Clear Clear   Leukocytes,UA 2+ (A) Negative   Protein,UA Negative Negative/Trace    Glucose, UA Negative Negative   Ketones, UA Negative Negative   RBC, UA Negative Negative   Bilirubin, UA Negative Negative   Urobilinogen, Ur 0.2 0.2 - 1.0 mg/dL   Nitrite, UA Negative Negative      Assessment & Plan:  Patient advised to avoid salt, and stop smoking.  Monitor blood pressure at home.  Advised to return in 1 month we will check blood pressure and start medication if needed. Problem List Items Addressed This Visit     Prediabetes   Mixed hyperlipidemia   Nicotine abuse   Other Visit Diagnoses       Essential hypertension, benign    -  Primary       Return in about 1 month (around 05/13/2023).   Total time spent: 25 minutes  Margaretann Loveless, MD  04/12/2023   This document may have been prepared by [facility] Voice Recognition software and as such may include unintentional dictation errors.

## 2023-04-13 ENCOUNTER — Other Ambulatory Visit: Payer: 59

## 2023-04-21 ENCOUNTER — Ambulatory Visit
Admission: RE | Admit: 2023-04-21 | Discharge: 2023-04-21 | Disposition: A | Discharge status: Home / Self Care | Payer: Medicaid Other | Source: Ambulatory Visit | Attending: Internal Medicine | Admitting: Internal Medicine

## 2023-04-21 DIAGNOSIS — Z1231 Encounter for screening mammogram for malignant neoplasm of breast: Secondary | ICD-10-CM | POA: Diagnosis present

## 2023-04-27 ENCOUNTER — Other Ambulatory Visit: Payer: Medicaid Other

## 2023-05-04 ENCOUNTER — Ambulatory Visit (INDEPENDENT_AMBULATORY_CARE_PROVIDER_SITE_OTHER): Payer: Medicaid Other

## 2023-05-04 DIAGNOSIS — Z720 Tobacco use: Secondary | ICD-10-CM | POA: Diagnosis not present

## 2023-05-04 DIAGNOSIS — Z122 Encounter for screening for malignant neoplasm of respiratory organs: Secondary | ICD-10-CM

## 2023-05-04 DIAGNOSIS — F17219 Nicotine dependence, cigarettes, with unspecified nicotine-induced disorders: Secondary | ICD-10-CM | POA: Diagnosis not present

## 2023-05-20 ENCOUNTER — Ambulatory Visit: Payer: Medicaid Other | Admitting: Internal Medicine

## 2023-05-20 DIAGNOSIS — J069 Acute upper respiratory infection, unspecified: Secondary | ICD-10-CM

## 2023-05-20 DIAGNOSIS — R051 Acute cough: Secondary | ICD-10-CM

## 2023-05-20 LAB — POCT XPERT XPRESS SARS COVID-2/FLU/RSV
FLU A: NEGATIVE
FLU B: NEGATIVE
RSV RNA, PCR: NEGATIVE
SARS Coronavirus 2: NEGATIVE

## 2023-05-21 ENCOUNTER — Encounter: Payer: Self-pay | Admitting: Internal Medicine

## 2023-05-21 NOTE — Progress Notes (Signed)
 Pt with flu like symptoms- but tested negative for all Covid/flu/RSV. Symptomatic care advised.

## 2024-01-25 ENCOUNTER — Ambulatory Visit: Admitting: Internal Medicine

## 2024-01-25 ENCOUNTER — Encounter: Payer: Self-pay | Admitting: Internal Medicine

## 2024-01-25 VITALS — BP 150/94 | HR 90 | Ht 69.0 in | Wt 205.0 lb

## 2024-01-25 DIAGNOSIS — E782 Mixed hyperlipidemia: Secondary | ICD-10-CM | POA: Diagnosis not present

## 2024-01-25 DIAGNOSIS — Z720 Tobacco use: Secondary | ICD-10-CM

## 2024-01-25 DIAGNOSIS — Z1211 Encounter for screening for malignant neoplasm of colon: Secondary | ICD-10-CM

## 2024-01-25 DIAGNOSIS — I361 Nonrheumatic tricuspid (valve) insufficiency: Secondary | ICD-10-CM

## 2024-01-25 DIAGNOSIS — I10 Essential (primary) hypertension: Secondary | ICD-10-CM

## 2024-01-25 DIAGNOSIS — R7303 Prediabetes: Secondary | ICD-10-CM | POA: Diagnosis not present

## 2024-01-25 MED ORDER — AMLODIPINE BESYLATE 5 MG PO TABS: 5.0000 mg | ORAL_TABLET | Freq: Every day | ORAL | 11 refills | Status: DC

## 2024-01-25 NOTE — Progress Notes (Signed)
 Established Patient Office Visit  Subjective:  Patient ID: Teresa Manning, female    DOB: 21-Mar-1965  Age: 58 y.o. MRN: [id number]  Chief Complaint  Patient presents with   Follow-up    Patient comes in for follow up today. Not in since her CPE in January this year. Had her mammogram and Low dose CT chest- negative but continues to smoke. Did not complete her Cologuard test- will send again. Her labs showed a high Hgba1c of 6.2- will repeat today. Her BP is also elevated. Previously on BP meds , but stopped as BP stayed low. Will start Norvasc 5mg  po every day. Patient advised again on diet control, weight reduction. Consider GLP-1 agonist for Prediabtes.    No other concerns at this time.   Past Medical History:  Diagnosis Date   Hypertension    pt statets pcp put her on bp meds but she never took bc she does not have htn    Past Surgical History:  Procedure Laterality Date   ACHILLES TENDON SURGERY Right 01/17/2021   Procedure: ACHILLES TENDON REPAIR;  Surgeon: Ashley Soulier, DPM;  Location: [facility];  Service: Podiatry;  Laterality: Right;   COLPOSCOPY     CYSTOSCOPY N/A 11/01/2018   Procedure: CYSTOSCOPY;  Surgeon: Arloa Lamar SQUIBB, MD;  Location: [facility];  Service: Gynecology;  Laterality: N/A;   OSTECTOMY Right 01/17/2021   Procedure: CALCANEAL EXOSTECTOMY NAD RIGHT THIRD TOE FLEXOR TENOTOMY;  Surgeon: Ashley Soulier, DPM;  Location: [facility];  Service: Podiatry;  Laterality: Right;   TOTAL LAPAROSCOPIC HYSTERECTOMY WITH BILATERAL SALPINGO OOPHORECTOMY N/A 11/01/2018   Procedure: TOTAL LAPAROSCOPIC HYSTERECTOMY WITH BILATERAL SALPINGO OOPHORECTOMY;  Surgeon: Arloa Lamar SQUIBB, MD;  Location: [facility];  Service: Gynecology;  Laterality: N/A;    Social History   Socioeconomic History   Marital status: Married    Spouse name: Not on file   Number of children: Not on file   Years of education: Not on file   Highest education level: Not on file  Occupational History    Not on file  Tobacco Use   Smoking status: Every Day    Current packs/day: 1.00    Average packs/day: 1 pack/day for 30.0 years (30.0 ttl pk-yrs)    Types: Cigarettes   Smokeless tobacco: Never  Vaping Use   Vaping status: Never Used  Substance and Sexual Activity   Alcohol use: No   Drug use: No   Sexual activity: Yes    Birth control/protection: None  Other Topics Concern   Not on file  Social History Narrative   Not on file   Social Drivers of Health   Financial Resource Strain: Not on file  Food Insecurity: Not on file  Transportation Needs: Not on file  Physical Activity: Not on file  Stress: Not on file  Social Connections: Not on file  Intimate Partner Violence: Not on file    Family History  Problem Relation Age of Onset   Breast cancer Mother    Diabetes Mother    Hypertension Mother    Hyperlipidemia Mother     No Known Allergies  Outpatient Medications Prior to Visit  Medication Sig   dicyclomine  (BENTYL ) 10 MG capsule Take 1 capsule (10 mg total) by mouth 4 (four) times daily -  before meals and at bedtime for 10 days. (Patient not taking: Reported on 01/25/2024)   oseltamivir  (TAMIFLU ) 75 MG capsule Take 1 capsule (75 mg total) by mouth 2 (two) times daily. (Patient not taking:  Reported on 01/25/2024)   oxyCODONE -acetaminophen  (PERCOCET) 5-325 MG tablet Take 1-2 tablets by mouth every 6 (six) hours as needed for severe pain. Max 6 tabs per day (Patient not taking: Reported on 01/25/2024)   No facility-administered medications prior to visit.    Review of Systems  Constitutional: Negative.  Negative for chills, fever and malaise/fatigue.  HENT: Negative.  Negative for congestion and sore throat.   Eyes: Negative.  Negative for blurred vision and pain.  Respiratory: Negative.  Negative for cough and shortness of breath.   Cardiovascular: Negative.  Negative for chest pain, palpitations and leg swelling.  Gastrointestinal: Negative.  Negative for  abdominal pain, blood in stool, constipation, diarrhea, heartburn, melena, nausea and vomiting.  Genitourinary: Negative.  Negative for dysuria, flank pain, frequency and urgency.  Musculoskeletal: Negative.  Negative for joint pain and myalgias.  Skin: Negative.   Neurological: Negative.  Negative for dizziness, tingling, sensory change, weakness and headaches.  Endo/Heme/Allergies: Negative.   Psychiatric/Behavioral: Negative.  Negative for depression and suicidal ideas. The patient is not nervous/anxious.        Objective:   BP (!) 150/94   Pulse 90   Ht 5' 9 (1.753 m)   Wt 205 lb (93 kg)   LMP 06/17/2016 (Exact Date)   SpO2 94%   BMI 30.27 kg/m   Vitals:   01/25/24 1000  BP: (!) 150/94  Pulse: 90  Height: 5' 9 (1.753 m)  Weight: 205 lb (93 kg)  SpO2: 94%  BMI (Calculated): 30.26    Physical Exam Vitals and nursing note reviewed.  Constitutional:      Appearance: Normal appearance.  HENT:     Head: Normocephalic and atraumatic.     Nose: Nose normal.     Mouth/Throat:     Mouth: Mucous membranes are moist.     Pharynx: Oropharynx is clear.  Eyes:     Conjunctiva/sclera: Conjunctivae normal.     Pupils: Pupils are equal, round, and reactive to light.  Cardiovascular:     Rate and Rhythm: Normal rate and regular rhythm.     Pulses: Normal pulses.     Heart sounds: Normal heart sounds. No murmur heard. Pulmonary:     Effort: Pulmonary effort is normal.     Breath sounds: Normal breath sounds. No wheezing.  Abdominal:     General: Bowel sounds are normal.     Palpations: Abdomen is soft.     Tenderness: There is no abdominal tenderness. There is no right CVA tenderness or left CVA tenderness.  Musculoskeletal:        General: Normal range of motion.     Cervical back: Normal range of motion.     Right lower leg: No edema.     Left lower leg: No edema.  Skin:    General: Skin is warm and dry.  Neurological:     General: No focal deficit present.      Mental Status: She is alert and oriented to person, place, and time.  Psychiatric:        Mood and Affect: Mood normal.        Behavior: Behavior normal.      No results found for any visits on 01/25/24.  No results found for this or any previous visit (from the past 2160 hours).    Assessment & Plan:  Check labs today. Start po Norvasc 5 mg/d. Cologuard. Problem List Items Addressed This Visit     Prediabetes   Relevant Orders  Hemoglobin A1c   CBC with Diff   Mixed hyperlipidemia   Relevant Medications   amLODipine (NORVASC) 5 MG tablet   Other Relevant Orders   CMP14+EGFR   Lipid Panel w/o Chol/HDL Ratio   Nicotine abuse   Relevant Orders   CBC with Diff   Other Visit Diagnoses       Essential hypertension, benign    -  Primary   Relevant Medications   amLODipine (NORVASC) 5 MG tablet     Colon cancer screening       Relevant Orders   Cologuard     Nonrheumatic tricuspid valve regurgitation       Relevant Medications   amLODipine (NORVASC) 5 MG tablet   Other Relevant Orders   PCV ECHOCARDIOGRAM COMPLETE       Return in about 10 days (around 02/04/2024).   Total time spent: 30 minutes. This time includes review of previous notes and results and patient face to face interaction during today's visit.    FERNAND FREDY RAMAN, MD  01/25/2024   This document may have been prepared by [facility] Voice Recognition software and as such may include unintentional dictation errors.

## 2024-01-26 LAB — LIPID PANEL W/O CHOL/HDL RATIO
Cholesterol, Total: 145 mg/dL (ref 100–199)
HDL: 68 mg/dL (ref >39)
LDL Chol Calc (NIH): 68 mg/dL (ref 0–99)
Triglycerides: 37 mg/dL (ref 0–149)
VLDL Cholesterol Cal: 9 mg/dL (ref 5–40)

## 2024-01-26 LAB — CBC WITH DIFFERENTIAL/PLATELET
Basophils Absolute: 0 x10E3/uL (ref 0.0–0.2)
Basos: 0 %
EOS (ABSOLUTE): 0.1 x10E3/uL (ref 0.0–0.4)
Eos: 2 %
Hematocrit: 41.2 % (ref 34.0–46.6)
Hemoglobin: 13.1 g/dL (ref 11.1–15.9)
Immature Grans (Abs): 0 x10E3/uL (ref 0.0–0.1)
Immature Granulocytes: 0 %
Lymphocytes Absolute: 1.7 x10E3/uL (ref 0.7–3.1)
Lymphs: 35 %
MCH: 29.8 pg (ref 26.6–33.0)
MCHC: 31.8 g/dL (ref 31.5–35.7)
MCV: 94 fL (ref 79–97)
Monocytes Absolute: 0.4 x10E3/uL (ref 0.1–0.9)
Monocytes: 8 %
Neutrophils Absolute: 2.7 x10E3/uL (ref 1.4–7.0)
Neutrophils: 55 %
Platelets: 247 x10E3/uL (ref 150–450)
RBC: 4.39 x10E6/uL (ref 3.77–5.28)
RDW: 12.6 % (ref 11.7–15.4)
WBC: 4.9 x10E3/uL (ref 3.4–10.8)

## 2024-01-26 LAB — CMP14+EGFR
ALT: 14 IU/L (ref 0–32)
AST: 21 IU/L (ref 0–40)
Albumin: 4 g/dL (ref 3.8–4.9)
Alkaline Phosphatase: 97 IU/L (ref 49–135)
BUN/Creatinine Ratio: 12 (ref 9–23)
BUN: 10 mg/dL (ref 6–24)
Bilirubin Total: 0.2 mg/dL (ref 0.0–1.2)
CO2: 25 mmol/L (ref 20–29)
Calcium: 9.2 mg/dL (ref 8.7–10.2)
Chloride: 105 mmol/L (ref 96–106)
Creatinine, Ser: 0.81 mg/dL (ref 0.57–1.00)
Globulin, Total: 2.9 g/dL (ref 1.5–4.5)
Glucose: 84 mg/dL (ref 70–99)
Potassium: 4.6 mmol/L (ref 3.5–5.2)
Sodium: 142 mmol/L (ref 134–144)
Total Protein: 6.9 g/dL (ref 6.0–8.5)
eGFR: 84 mL/min/1.73 (ref >59)

## 2024-01-26 LAB — HEMOGLOBIN A1C
Est. average glucose Bld gHb Est-mCnc: 120 mg/dL
Hgb A1c MFr Bld: 5.8 % — ABNORMAL HIGH (ref 4.8–5.6)

## 2024-01-27 ENCOUNTER — Ambulatory Visit: Payer: Self-pay | Admitting: Internal Medicine

## 2024-01-28 NOTE — Progress Notes (Signed)
 Patient notified

## 2024-02-04 ENCOUNTER — Ambulatory Visit (INDEPENDENT_AMBULATORY_CARE_PROVIDER_SITE_OTHER): Admitting: Internal Medicine

## 2024-02-04 ENCOUNTER — Ambulatory Visit: Admitting: Internal Medicine

## 2024-02-04 ENCOUNTER — Encounter: Payer: Self-pay | Admitting: Internal Medicine

## 2024-02-04 VITALS — BP 144/90 | HR 76 | Ht 69.0 in | Wt 204.4 lb

## 2024-02-04 DIAGNOSIS — Z720 Tobacco use: Secondary | ICD-10-CM

## 2024-02-04 DIAGNOSIS — R7303 Prediabetes: Secondary | ICD-10-CM

## 2024-02-04 DIAGNOSIS — E66811 Obesity, class 1: Secondary | ICD-10-CM | POA: Diagnosis not present

## 2024-02-04 DIAGNOSIS — I10 Essential (primary) hypertension: Secondary | ICD-10-CM | POA: Diagnosis not present

## 2024-02-04 MED ORDER — SEMAGLUTIDE(0.25 OR 0.5MG/DOS) 2 MG/3ML ~~LOC~~ SOPN
0.5000 mg | PEN_INJECTOR | SUBCUTANEOUS | 0 refills | Status: AC
Start: 2024-02-04 — End: ?

## 2024-02-04 MED ORDER — AMLODIPINE BESY-BENAZEPRIL HCL 5-10 MG PO CAPS: 1.0000 | ORAL_CAPSULE | Freq: Every day | ORAL | 11 refills | Status: AC

## 2024-02-04 NOTE — Progress Notes (Signed)
 Established Patient Office Visit  Subjective:  Patient ID: Teresa Manning, female    DOB: 1965/05/09  Age: 58 y.o. MRN: [id number]  Chief Complaint  Patient presents with   Follow-up    10 day follow up    Patient comes in for her follow up today. Tolerating Norvasc  5mg /d, but BP still high, although better than before. Will switch to Lotrel 5/10 mg/d. Lab results discussed, hgba1c is 5.8. Patient has been advised strict diet control , and exercise. Has used Ozempic  before. Will try rx again.    No other concerns at this time.   Past Medical History:  Diagnosis Date   Hypertension    pt statets pcp put her on bp meds but she never took bc she does not have htn    Past Surgical History:  Procedure Laterality Date   ACHILLES TENDON SURGERY Right 01/17/2021   Procedure: ACHILLES TENDON REPAIR;  Surgeon: Ashley Soulier, DPM;  Location: [facility];  Service: Podiatry;  Laterality: Right;   COLPOSCOPY     CYSTOSCOPY N/A 11/01/2018   Procedure: CYSTOSCOPY;  Surgeon: Arloa Lamar SQUIBB, MD;  Location: [facility];  Service: Gynecology;  Laterality: N/A;   OSTECTOMY Right 01/17/2021   Procedure: CALCANEAL EXOSTECTOMY NAD RIGHT THIRD TOE FLEXOR TENOTOMY;  Surgeon: Ashley Soulier, DPM;  Location: [facility];  Service: Podiatry;  Laterality: Right;   TOTAL LAPAROSCOPIC HYSTERECTOMY WITH BILATERAL SALPINGO OOPHORECTOMY N/A 11/01/2018   Procedure: TOTAL LAPAROSCOPIC HYSTERECTOMY WITH BILATERAL SALPINGO OOPHORECTOMY;  Surgeon: Arloa Lamar SQUIBB, MD;  Location: [facility];  Service: Gynecology;  Laterality: N/A;    Social History   Socioeconomic History   Marital status: Married    Spouse name: Not on file   Number of children: Not on file   Years of education: Not on file   Highest education level: Not on file  Occupational History   Not on file  Tobacco Use   Smoking status: Every Day    Current packs/day: 1.00    Average packs/day: 1 pack/day for 30.0 years (30.0 ttl pk-yrs)    Types:  Cigarettes   Smokeless tobacco: Never  Vaping Use   Vaping status: Never Used  Substance and Sexual Activity   Alcohol use: No   Drug use: No   Sexual activity: Yes    Birth control/protection: None  Other Topics Concern   Not on file  Social History Narrative   Not on file   Social Drivers of Health   Financial Resource Strain: Not on file  Food Insecurity: Not on file  Transportation Needs: Not on file  Physical Activity: Not on file  Stress: Not on file  Social Connections: Not on file  Intimate Partner Violence: Not on file    Family History  Problem Relation Age of Onset   Breast cancer Mother    Diabetes Mother    Hypertension Mother    Hyperlipidemia Mother     No Known Allergies  Outpatient Medications Prior to Visit  Medication Sig   [DISCONTINUED] amLODipine  (NORVASC ) 5 MG tablet Take 1 tablet (5 mg total) by mouth daily.   dicyclomine  (BENTYL ) 10 MG capsule Take 1 capsule (10 mg total) by mouth 4 (four) times daily -  before meals and at bedtime for 10 days. (Patient not taking: Reported on 02/04/2024)   oseltamivir  (TAMIFLU ) 75 MG capsule Take 1 capsule (75 mg total) by mouth 2 (two) times daily. (Patient not taking: Reported on 02/04/2024)   oxyCODONE -acetaminophen  (PERCOCET) 5-325 MG tablet Take 1-2  tablets by mouth every 6 (six) hours as needed for severe pain. Max 6 tabs per day (Patient not taking: Reported on 02/04/2024)   No facility-administered medications prior to visit.    Review of Systems  Constitutional: Negative.  Negative for chills, fever and malaise/fatigue.  HENT: Negative.  Negative for congestion and sore throat.   Eyes: Negative.  Negative for blurred vision and pain.  Respiratory: Negative.  Negative for cough and shortness of breath.   Cardiovascular: Negative.  Negative for chest pain, palpitations and leg swelling.  Gastrointestinal: Negative.  Negative for abdominal pain, blood in stool, constipation, diarrhea, heartburn,  melena, nausea and vomiting.  Genitourinary: Negative.  Negative for dysuria, flank pain, frequency and urgency.  Musculoskeletal: Negative.  Negative for joint pain and myalgias.  Skin: Negative.   Neurological: Negative.  Negative for dizziness, tingling, sensory change, weakness and headaches.  Endo/Heme/Allergies: Negative.   Psychiatric/Behavioral: Negative.  Negative for depression and suicidal ideas. The patient is not nervous/anxious.        Objective:   BP (!) 144/90   Pulse 76   Ht 5' 9 (1.753 m)   Wt 204 lb 6.4 oz (92.7 kg)   LMP 06/17/2016 (Exact Date)   SpO2 99%   BMI 30.18 kg/m   Vitals:   02/04/24 1145  BP: (!) 144/90  Pulse: 76  Height: 5' 9 (1.753 m)  Weight: 204 lb 6.4 oz (92.7 kg)  SpO2: 99%  BMI (Calculated): 30.17    Physical Exam Vitals and nursing note reviewed.  Constitutional:      Appearance: Normal appearance.  HENT:     Head: Normocephalic and atraumatic.     Nose: Nose normal.     Mouth/Throat:     Mouth: Mucous membranes are moist.     Pharynx: Oropharynx is clear.  Eyes:     Conjunctiva/sclera: Conjunctivae normal.     Pupils: Pupils are equal, round, and reactive to light.  Cardiovascular:     Rate and Rhythm: Normal rate and regular rhythm.     Pulses: Normal pulses.     Heart sounds: Normal heart sounds. No murmur heard. Pulmonary:     Effort: Pulmonary effort is normal.     Breath sounds: Normal breath sounds. No wheezing.  Abdominal:     General: Bowel sounds are normal.     Palpations: Abdomen is soft.     Tenderness: There is no abdominal tenderness. There is no right CVA tenderness or left CVA tenderness.  Musculoskeletal:        General: Normal range of motion.     Cervical back: Normal range of motion.     Right lower leg: No edema.     Left lower leg: No edema.  Skin:    General: Skin is warm and dry.  Neurological:     General: No focal deficit present.     Mental Status: She is alert and oriented to person,  place, and time.  Psychiatric:        Mood and Affect: Mood normal.        Behavior: Behavior normal.      No results found for any visits on 02/04/24.  Recent Results (from the past 2160 hours)  CMP14+EGFR     Status: None   Collection Time: 01/25/24 10:31 AM  Result Value Ref Range   Glucose 84 70 - 99 mg/dL   BUN 10 6 - 24 mg/dL   Creatinine, Ser 9.18 0.57 - 1.00 mg/dL   eGFR  84 >59 mL/min/1.73   BUN/Creatinine Ratio 12 9 - 23   Sodium 142 134 - 144 mmol/L   Potassium 4.6 3.5 - 5.2 mmol/L   Chloride 105 96 - 106 mmol/L   CO2 25 20 - 29 mmol/L   Calcium 9.2 8.7 - 10.2 mg/dL   Total Protein 6.9 6.0 - 8.5 g/dL   Albumin 4.0 3.8 - 4.9 g/dL   Globulin, Total 2.9 1.5 - 4.5 g/dL   Bilirubin Total 0.2 0.0 - 1.2 mg/dL   Alkaline Phosphatase 97 49 - 135 IU/L   AST 21 0 - 40 IU/L   ALT 14 0 - 32 IU/L  Lipid Panel w/o Chol/HDL Ratio     Status: None   Collection Time: 01/25/24 10:31 AM  Result Value Ref Range   Cholesterol, Total 145 100 - 199 mg/dL   Triglycerides 37 0 - 149 mg/dL   HDL 68 >60 mg/dL   VLDL Cholesterol Cal 9 5 - 40 mg/dL   LDL Chol Calc (NIH) 68 0 - 99 mg/dL  Hemoglobin J8r     Status: Abnormal   Collection Time: 01/25/24 10:31 AM  Result Value Ref Range   Hgb A1c MFr Bld 5.8 (H) 4.8 - 5.6 %    Comment:          Prediabetes: 5.7 - 6.4          Diabetes: >6.4          Glycemic control for adults with diabetes: <7.0    Est. average glucose Bld gHb Est-mCnc 120 mg/dL  CBC with Diff     Status: None   Collection Time: 01/25/24 10:31 AM  Result Value Ref Range   WBC 4.9 3.4 - 10.8 x10E3/uL   RBC 4.39 3.77 - 5.28 x10E6/uL   Hemoglobin 13.1 11.1 - 15.9 g/dL   Hematocrit 58.7 65.9 - 46.6 %   MCV 94 79 - 97 fL   MCH 29.8 26.6 - 33.0 pg   MCHC 31.8 31.5 - 35.7 g/dL   RDW 87.3 88.2 - 84.5 %   Platelets 247 150 - 450 x10E3/uL   Neutrophils 55 Not Estab. %   Lymphs 35 Not Estab. %   Monocytes 8 Not Estab. %   Eos 2 Not Estab. %   Basos 0 Not Estab. %    Neutrophils Absolute 2.7 1.4 - 7.0 x10E3/uL   Lymphocytes Absolute 1.7 0.7 - 3.1 x10E3/uL   Monocytes Absolute 0.4 0.1 - 0.9 x10E3/uL   EOS (ABSOLUTE) 0.1 0.0 - 0.4 x10E3/uL   Basophils Absolute 0.0 0.0 - 0.2 x10E3/uL   Immature Granulocytes 0 Not Estab. %   Immature Grans (Abs) 0.0 0.0 - 0.1 x10E3/uL      Assessment & Plan:  Change to Lotrel 5/10 mg/d. Rx sent for Ozempic  0.5 mg/week. Problem List Items Addressed This Visit     Prediabetes   Relevant Medications   Semaglutide ,0.25 or 0.5MG /DOS, 2 MG/3ML SOPN   Nicotine abuse   Obesity (BMI 30.0-34.9)   Relevant Medications   Semaglutide ,0.25 or 0.5MG /DOS, 2 MG/3ML SOPN   Other Visit Diagnoses       Essential hypertension, benign    -  Primary   Relevant Medications   amLODipine -benazepril  (LOTREL) 5-10 MG capsule       Follow up 1 month.  Total time spent: 30 minutes. This time includes review of previous notes and results and patient face to face interaction during today's visit.    FERNAND FREDY RAMAN, MD  02/04/2024   This  document may have been prepared by [employer] Voice Recognition software and as such may include unintentional dictation errors.

## 2024-02-09 ENCOUNTER — Other Ambulatory Visit

## 2024-02-09 DIAGNOSIS — I361 Nonrheumatic tricuspid (valve) insufficiency: Secondary | ICD-10-CM | POA: Diagnosis not present

## 2024-02-09 DIAGNOSIS — I371 Nonrheumatic pulmonary valve insufficiency: Secondary | ICD-10-CM

## 2024-02-28 ENCOUNTER — Encounter: Admitting: Internal Medicine

## 2024-03-31 ENCOUNTER — Other Ambulatory Visit: Payer: Self-pay

## 2024-03-31 ENCOUNTER — Emergency Department: Admission: EM | Admit: 2024-03-31 | Discharge: 2024-03-31 | Disposition: A | Discharge status: Home / Self Care

## 2024-03-31 DIAGNOSIS — U071 COVID-19: Secondary | ICD-10-CM | POA: Diagnosis not present

## 2024-03-31 DIAGNOSIS — I10 Essential (primary) hypertension: Secondary | ICD-10-CM | POA: Diagnosis not present

## 2024-03-31 DIAGNOSIS — R509 Fever, unspecified: Secondary | ICD-10-CM | POA: Diagnosis present

## 2024-03-31 LAB — RESP PANEL BY RT-PCR (RSV, FLU A&B, COVID)  RVPGX2
Influenza A by PCR: NEGATIVE
Influenza B by PCR: NEGATIVE
Resp Syncytial Virus by PCR: NEGATIVE
SARS Coronavirus 2 by RT PCR: POSITIVE — AB

## 2024-03-31 MED ORDER — ACETAMINOPHEN 325 MG PO TABS
650.0000 mg | ORAL_TABLET | Freq: Once | ORAL | Status: AC | PRN
  Administered 2024-03-31: 650 mg via ORAL
  Filled 2024-03-31: qty 2

## 2024-03-31 NOTE — Discharge Instructions (Addendum)
 You tested positive for the COVID today.  This is a viral illness which will resolve on its own with time.  You do not need an antibiotic.  You can take over-the-counter cold medicine as needed to manage your symptoms.  If you are taking combination cold medicine keep in mind that this often contains Tylenol  so if you need additional medication for body aches or fever control please take Motrin or ibuprofen.  Your symptoms should resolve with time, if you have had symptoms for greater than 10 days please be evaluated by another healthcare provider as at this point it may have developed into a bacterial infection which requires a different treatment.  Return to the emergency department with worsening symptoms.

## 2024-03-31 NOTE — ED Triage Notes (Addendum)
 Pt presents with cough, subjective fever, HA, fatigue that started yesterday. Denies ShOB. Positive sick contacts with her son. Pt has not had seasonal flu vaccine and only initial COVID vaccine in 2021

## 2024-03-31 NOTE — ED Provider Notes (Signed)
 "  [facility] Provider Note    Event Date/Time   First MD Initiated Contact with Patient 03/31/24 1506     (approximate)   History   Cough   HPI  NICOLEMARIE WOOLEY is a 59 y.o. female with PMH of hypertension who presents for evaluation of cough, fever, fatigue and headache that began yesterday.  Patient denies shortness of breath and chest pain.  She has had sick contacts with her son.      Physical Exam   Triage Vital Signs: ED Triage Vitals  Encounter Vitals Group     BP 03/31/24 1259 128/84     Girls Systolic BP Percentile --      Girls Diastolic BP Percentile --      Boys Systolic BP Percentile --      Boys Diastolic BP Percentile --      Pulse Rate 03/31/24 1259 (!) 102     Resp 03/31/24 1259 20     Temp 03/31/24 1259 (!) 102.5 F (39.2 C)     Temp Source 03/31/24 1259 Oral     SpO2 03/31/24 1259 97 %     Weight 03/31/24 1254 195 lb (88.5 kg)     Height 03/31/24 1254 5' 8 (1.727 m)     Head Circumference --      Peak Flow --      Pain Score 03/31/24 1254 4     Pain Loc --      Pain Education --      Exclude from Growth Chart --     Most recent vital signs: Vitals:   03/31/24 1443 03/31/24 1514  BP:  131/82  Pulse:  99  Resp:  20  Temp: (!) 100.6 F (38.1 C)   SpO2:  97%   General: Awake, no distress.  CV:  Good peripheral perfusion.  RRR.  Resp:  Normal effort. CTAB. Abd:  No distention.  Other:     ED Results / Procedures / Treatments   Labs (all labs ordered are listed, but only abnormal results are displayed) Labs Reviewed  RESP PANEL BY RT-PCR (RSV, FLU A&B, COVID)  [id number] - Abnormal; Notable for the following components:      Result Value   SARS Coronavirus 2 by RT PCR POSITIVE (*)    All other components within normal limits    PROCEDURES:  Critical Care performed: No  Procedures   MEDICATIONS ORDERED IN ED: Medications  acetaminophen  (TYLENOL ) tablet 650 mg (650 mg Oral Given 03/31/24 1304)      IMPRESSION / MDM / ASSESSMENT AND PLAN / ED COURSE  I reviewed the triage vital signs and the nursing notes.                             59 year old female presents for evaluation of a cough. Patient was febrile and tachycardic on initial presentation. VSS otherwise. Patient uncomfortable appearing.   Differential diagnosis includes, but is not limited to, flu, covid, rsv, other viral URI.  Patient's presentation is most consistent with acute complicated illness / injury requiring diagnostic workup.  Fever improved after tylenol .  Resp panel is positive for COVID. Patient advised on symptomatic management. She was given a note for work. She voiced understanding, all questions were answered and she was stable at discharge.       FINAL CLINICAL IMPRESSION(S) / ED DIAGNOSES   Final diagnoses:  COVID  Rx / DC Orders   ED Discharge Orders     None        Note:  This document was prepared using Dragon voice recognition software and may include unintentional dictation errors.   Cleaster Tinnie LABOR, PA-C 03/31/24 1517  "
